# Patient Record
Sex: Female | Born: 1955 | Race: Black or African American | Hispanic: No | Smoking: Never smoker
Health system: Southern US, Community
[De-identification: ages and names within clinical notes are randomized; demographics above are authoritative.]

## PROBLEM LIST (undated history)

## (undated) DIAGNOSIS — C801 Malignant (primary) neoplasm, unspecified: Secondary | ICD-10-CM

## (undated) DIAGNOSIS — Z9221 Personal history of antineoplastic chemotherapy: Secondary | ICD-10-CM

## (undated) HISTORY — PX: ABDOMINAL HYSTERECTOMY: SHX81

## (undated) SURGERY — Surgical Case
Anesthesia: *Unknown

---

## 2004-08-18 ENCOUNTER — Ambulatory Visit: Payer: Self-pay | Admitting: Internal Medicine

## 2004-08-21 ENCOUNTER — Ambulatory Visit: Payer: Self-pay | Admitting: Internal Medicine

## 2005-02-23 ENCOUNTER — Ambulatory Visit: Payer: Self-pay | Admitting: Internal Medicine

## 2005-12-24 ENCOUNTER — Ambulatory Visit: Payer: Self-pay | Admitting: Internal Medicine

## 2006-06-21 ENCOUNTER — Ambulatory Visit: Payer: Self-pay | Admitting: Internal Medicine

## 2007-06-23 ENCOUNTER — Ambulatory Visit: Payer: Self-pay | Admitting: Internal Medicine

## 2008-10-25 ENCOUNTER — Ambulatory Visit: Payer: Self-pay | Admitting: Internal Medicine

## 2009-10-31 ENCOUNTER — Ambulatory Visit: Payer: Self-pay | Admitting: Internal Medicine

## 2010-11-04 ENCOUNTER — Ambulatory Visit: Payer: Self-pay | Admitting: Internal Medicine

## 2011-11-09 ENCOUNTER — Ambulatory Visit: Payer: Self-pay | Admitting: Internal Medicine

## 2012-07-10 DIAGNOSIS — C801 Malignant (primary) neoplasm, unspecified: Secondary | ICD-10-CM

## 2012-07-10 HISTORY — DX: Malignant (primary) neoplasm, unspecified: C80.1

## 2012-08-01 LAB — CBC
HCT: 28.8 % — ABNORMAL LOW (ref 35.0–47.0)
MCH: 31 pg (ref 26.0–34.0)
MCHC: 34 g/dL (ref 32.0–36.0)
MCV: 91 fL (ref 80–100)
Platelet: 257 10*3/uL (ref 150–440)
RBC: 3.16 10*6/uL — ABNORMAL LOW (ref 3.80–5.20)
RDW: 14.2 % (ref 11.5–14.5)
WBC: 16.4 10*3/uL — ABNORMAL HIGH (ref 3.6–11.0)

## 2012-08-01 LAB — COMPREHENSIVE METABOLIC PANEL
Albumin: 3.6 g/dL (ref 3.4–5.0)
Alkaline Phosphatase: 62 U/L (ref 50–136)
BUN: 38 mg/dL — ABNORMAL HIGH (ref 7–18)
Bilirubin,Total: 0.3 mg/dL (ref 0.2–1.0)
Chloride: 107 mmol/L (ref 98–107)
Creatinine: 0.84 mg/dL (ref 0.60–1.30)
EGFR (African American): >60
Osmolality: 294 (ref 275–301)
SGPT (ALT): 23 U/L (ref 12–78)
Total Protein: 6.8 g/dL (ref 6.4–8.2)

## 2012-08-02 ENCOUNTER — Inpatient Hospital Stay: Payer: Self-pay | Admitting: Unknown Physician Specialty

## 2012-08-02 LAB — PROTIME-INR
INR: 1
Prothrombin Time: 13.9 secs (ref 11.5–14.7)

## 2012-08-02 LAB — APTT: Activated PTT: 26.7 secs (ref 23.6–35.9)

## 2012-08-03 LAB — CBC WITH DIFFERENTIAL/PLATELET
Basophil #: 0.1 10*3/uL (ref 0.0–0.1)
Basophil %: 0.5 %
Eosinophil %: 0.3 %
Lymphocyte #: 2.8 10*3/uL (ref 1.0–3.6)
Lymphocyte %: 19.9 %
MCH: 29.8 pg (ref 26.0–34.0)
MCV: 89 fL (ref 80–100)
Monocyte #: 1.2 x10 3/mm — ABNORMAL HIGH (ref 0.2–0.9)
Monocyte %: 8.7 %
Neutrophil %: 70.6 %
Platelet: 153 10*3/uL (ref 150–440)
RBC: 3.14 10*6/uL — ABNORMAL LOW (ref 3.80–5.20)
RDW: 15.8 % — ABNORMAL HIGH (ref 11.5–14.5)
WBC: 14 10*3/uL — ABNORMAL HIGH (ref 3.6–11.0)

## 2012-08-03 LAB — COMPREHENSIVE METABOLIC PANEL
Alkaline Phosphatase: 57 U/L (ref 50–136)
Anion Gap: 8 (ref 7–16)
BUN: 16 mg/dL (ref 7–18)
Bilirubin,Total: 0.4 mg/dL (ref 0.2–1.0)
Calcium, Total: 7.6 mg/dL — ABNORMAL LOW (ref 8.5–10.1)
Chloride: 113 mmol/L — ABNORMAL HIGH (ref 98–107)
Creatinine: 0.66 mg/dL (ref 0.60–1.30)
EGFR (African American): >60
EGFR (Non-African Amer.): >60
Glucose: 87 mg/dL (ref 65–99)
SGOT(AST): 15 U/L (ref 15–37)
Total Protein: 5 g/dL — ABNORMAL LOW (ref 6.4–8.2)

## 2012-08-03 LAB — HEMOGLOBIN: HGB: 9.3 g/dL — ABNORMAL LOW (ref 12.0–16.0)

## 2012-10-13 ENCOUNTER — Ambulatory Visit: Payer: Self-pay | Admitting: Unknown Physician Specialty

## 2012-10-17 LAB — PATHOLOGY REPORT

## 2012-11-10 ENCOUNTER — Ambulatory Visit: Payer: Self-pay

## 2013-11-14 ENCOUNTER — Ambulatory Visit: Payer: Self-pay

## 2014-12-19 ENCOUNTER — Ambulatory Visit: Payer: Self-pay

## 2015-02-26 NOTE — Consult Note (Signed)
CC: duodenal mass with UGI bleed.  Pt will be transfered to [facility] due to lack of EUS here.  Pt aware,. they have accepted the patient and will transfer tonight while she is stable. Will send copy of EGD with her.  Electronic Signatures: Manya Silvas (MD)  (Signed on 25-Sep-13 17:22)  Authored  Last Updated: 25-Sep-13 17:22 by Manya Silvas (MD)

## 2015-02-26 NOTE — H&P (Signed)
PATIENT NAME:  Tonya Brewer, Tonya Brewer MR#:  [id number] DATE OF BIRTH:  September 10, 1956  DATE OF ADMISSION:  08/02/2012  PRIMARY CARE PHYSICIAN: Dr. Andrey Farmer, but now she is changing to Dr. Ola Spurr.   REFERRING PHYSICIAN: Dr. Liana Gerold.   CHIEF COMPLAINT: Black, tarry stool and one episode of vomiting with some red blood material.   HISTORY OF PRESENT ILLNESS: Ms. Tonya Brewer is a 59 year old African American female with unremarkable past medical history apart from hypertension for which she is taking a diuretic and hypercholesterolemia. The patient was in her usual state of health until about Sunday, that is two days ago, when she started seeing black, tarry stool associated with epigastric dull pain. The severity was mild. With time she developed generalized weakness. Finally, due to persistence of melena, she decided to come to the Emergency Department and around 9:00 p.m. last night she had one episode of vomiting after she ate yogurt. The vomiting was consistent of digested material along with red blood mixed with that. The patient was evaluated here in the Emergency Department and she was found to be tachycardiac and her hemoglobin was 9.8. The patient is now in the process of being admitted to the Intensive Care Unit for further evaluation and treatment.   REVIEW OF SYSTEMS: CONSTITUTIONAL: Denies any fever. No chills, but she has fatigue. EYES: No blurring of vision. No double vision. ENT: No hearing impairment. No sore throat. No dysphagia. CARDIOVASCULAR: No chest pain. No shortness of breath. No edema. No syncope. RESPIRATORY: No cough. No production. No shortness of breath. No chest pain.  GASTROINTESTINAL: She had epigastric pain, melena and one episode of vomiting some red blood. GENITOURINARY: No dysuria. No frequency of urination. MUSCULOSKELETAL: No joint pain or swelling. No muscular pain or swelling. INTEGUMENTARY: No skin rash. No ulcers. NEUROLOGY: No focal weakness. No seizure activity.  No headache. PSYCHIATRY: No anxiety. No depression. ENDOCRINE: No polyuria or polydipsia. No heat or cold intolerance.   PAST MEDICAL HISTORY:    1. In 2003 she had colonoscopy that revealed three polyps at the transverse colon. Four of them were 7 mm, one of them was 5 mm. Also found to have diverticulosis and internal hemorrhoids. She is supposed to follow up in two years.  2. She had esophagogastroduodenoscopy or upper endoscope in 2003 and that showed normal esophagus and duodenum. There was some gastritis.  3. Hypertension.  4. Hypercholesterolemia.   PAST SURGICAL HISTORY: Hysterectomy.   FAMILY HISTORY: Her father died in his 1s after having stroke. Her mother died at age of 70 from diabetic coma.   SOCIAL HISTORY: She is married, living with her husband. 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   ADMISSION MEDICATIONS:  1. Hydrochlorothiazide once a day. 2. Pravastatin 40 mg a day.   ALLERGIES: No known drug allergies.   PHYSICAL EXAMINATION:  VITAL SIGNS: Blood pressure 128/82, respiratory rate 16, pulse 106, temperature 98.9. Her oxygen saturation 98%.   GENERAL APPEARANCE: Middle-aged female lying in bed in no acute distress.   HEENT: No pallor. No icterus. No cyanosis.   ENT: Hearing was normal. Nasal mucosa, lips, tongue were normal.   EYES: Normal eyelids and conjunctivae. Pupils about 5 mm, equal and reactive to light.   NECK: Supple. Trachea at midline. No thyromegaly. No cervical lymphadenopathy. No masses.   HEART: Normal S1, S2. No S3 or S4. No murmur. No gallop. No carotid bruits.   RESPIRATORY: Normal breathing pattern without use of accessory  muscles. No rales. No wheezing.   ABDOMEN: Soft. No tenderness. No rebound. No rigidity. No hepatosplenomegaly. No masses. No hernias.   SKIN: No ulcers. No subcutaneous nodules.   MUSCULOSKELETAL: No joint swelling. No clubbing.   NEUROLOGIC: Cranial nerves II through XII are  intact. No focal motor deficit.   PSYCHIATRIC: The patient is alert, oriented and oriented x3. Mood and affect were normal.   LABORATORY, DIAGNOSTIC, AND RADIOLOGICAL DATA: Serum glucose 127, BUN 38, creatinine 0.8, sodium 142, potassium 4.4. Lipase 61. Normal liver function tests. CBC showed white count 16,000, hemoglobin 9.8, hematocrit 28, platelet count 257. Prothrombin time 13. INR 1. APTT 26.   ASSESSMENT:  1. Melena x2 days. 2. One episode of hematemesis. 3. Sinus tachycardia, likely secondary to volume depletion from gastrointestinal bleed, but there is possibly some anxiety component as well.  4. Leukocytosis likely reactive to the gastrointestinal bleed.   OTHER MEDICAL PROBLEMS:  1. History of colon polyps.  2. Systemic hypertension.  3. Hypercholesterolemia.   PLAN:  1. Will admit addition to the Intensive Care Unit for close monitoring.  2. Follow-up on hemoglobin every six hours.  3. Intravenous Protonix 40 mg was given, then intravenous Protonix drip was ordered.  4. Type and hold 1 unit of packed red blood cells.  5. Gastroenterology consultation with Dr. Gustavo Lah.  6. IV hydration with normal saline.  7. I will hold her home medications.  8. Keep the patient n.p.o.   TIME SPENT EVALUATING THIS PATIENT: More than 55 minutes.   ____________________________ Clovis Pu. Lenore Manner, MD amd:ap D: 08/02/2012 04:01:18 ET T: 08/02/2012 09:03:36 ET JOB#: [id number]  cc: Clovis Pu. Lenore Manner, MD, <Dictator> Cheral Marker. Ola Spurr, MD Ellin Saba MD ELECTRONICALLY SIGNED 08/02/2012 22:34

## 2015-02-26 NOTE — Consult Note (Signed)
CC:  UGI bleed,  pt had duodenal mass with erosion on EGD.  CT shows 3cm mass in this area but not of pancreas.  VSS, afebrile, hgb stable at 9.1.  No further vomiting.  She needs an endoscopic ultrasound and she and husband would like to use [facility] so I will contact Dr. Ardis Hughs about doing this.  Possiblilty of surgery discussed.  Will start clear liquids.  Electronic Signatures: Manya Silvas (MD)  (Signed on 25-Sep-13 10:59)  Authored  Last Updated: 25-Sep-13 10:59 by Manya Silvas (MD)

## 2015-02-26 NOTE — Consult Note (Signed)
CC: GI bleed, EGD showed mass with erosion into the duodenal mucosa in area 2ed-third portion.  Fresh blood caked on mucosa.  This appears to be the bleeding area.  Also showed diffuse mild gastritis without fresh blood.  Will get surgical consult and transfuse a third unit of blood.  Clear liquid diet.  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  Get CT of abd tomorrow with contrast.    Electronic Signatures: Manya Silvas (MD)  (Signed on 24-Sep-13 17:30)  Authored  Last Updated: 24-Sep-13 17:30 by Manya Silvas (MD)

## 2015-02-26 NOTE — Consult Note (Signed)
PATIENT NAME:  Tonya Brewer, Tonya Brewer MR#:  [id number] DATE OF BIRTH:  [birth year]  DATE OF CONSULTATION:  08/02/2012  REFERRING PHYSICIAN:   CONSULTING PHYSICIAN:  Manya Silvas, MD  HISTORY OF PRESENT ILLNESS: The patient is a 59 year old black female who had the onset of epigastric abdominal pain Sunday. She had vomiting Monday night with food and bright red blood mixed in with the food.  She was brought to the ER and found to have tachycardia and hemoglobin 9.8. She was admitted to the hospital and I was asked to see her in consultation for GI bleeding.   Two days ago she started seeing black, tarry stools with the onset of the epigastric pain.   She denies any nonsteroidal anti-inflammatory drug use such as Advil, Aleve, ibuprofen, Goody's, BCs, Bufferin.   PAST MEDICAL HISTORY:  1. Colonoscopy in 2003 with a few polyps, diverticulosis, and internal hemorrhoids. Upper endoscopy showed gastritis at that time.  2. Hypertension.  3. Hypercholesterolemia,   FAMILY HISTORY: Father died in his 26s of a stroke. Mother died at age 80, diabetes.   HABITS: Does not smoke, does not drink, does not chew, does not drip.   SOCIAL HISTORY: Works at a [employer] home.   MEDICATIONS:  1. HCTZ once a day.  2. Pravastatin 40 mg a day.   ALLERGIES: No known drug allergies.   PAST SURGICAL HISTORY:  Hysterectomy.   REVIEW OF SYSTEMS: No syncopal spells. No severe headaches. No recent changes in vision or hearing. No sores in the mouth. No bleeding from her nose. No trouble swallowing her food. No asthma, wheezing, emphysema, chronic cough, or shortness of breath. No exertional chest pains. No skipping, irregular heartbeats. No other abdominal pain except the epigastric pain. No diarrhea. No previous GI bleeding noted. No rashes. No severe arthritis.   PHYSICAL EXAMINATION:  GENERAL: Pleasant black female in no acute distress.  VITAL SIGNS:  Pulse 112, respirations 20, blood pressure 127/63,  oxygen saturation 97% on room air.   HEENT: Sclerae anicteric. Conjunctivae slightly pale. Tongue is somewhat pale.  Head is atraumatic. There is no blood in her mouth or on the tongue or coming from the nose.   CHEST: Clear.   HEART: No murmurs or gallops. Mild tachycardia.   ABDOMEN: Bowel sounds present. No hepatosplenomegaly. No masses. No bruits. No significant tenderness.   EXTREMITIES: No edema.   SKIN: Warm and dry.   PSYCH: Mood and affect are appropriate and alert.  LABORATORY, DIAGNOSTIC, AND RADIOLOGICAL DATA: Glucose 127, BUN 38, creatinine 0.84, sodium 142, potassium 4.4, chloride 107, CO2 27, lipase 61, calcium 9.3, total protein 6.8, albumin 3.6, total bilirubin 0.3, alkaline phosphatase 62, SGOT 17, SGPT 23, white count 16.4, hemoglobin 9.8, hematocrit 28.8, platelet count 257. Repeat hemoglobin done eight hours later shows it down to 7.5. She B+ blood with negative antibody screen. PT 13.9, INR 1.   ASSESSMENT: Upper GI bleed. Given her elevated BUN to creatinine ratio, her black stools, her hematemesis, probably an ulcer, either gastric or duodenal.   PLAN: The plan is to repeat her hemoglobin in a few minutes and if below 7 give a transfusion. Do an upper endoscopy this afternoon and keep patient n.p.o. after some water at this time. The plan was discussed the patient and she is in agreement.     ____________________________ Manya Silvas, MD rte:bjt D: 08/02/2012 12:57:44 ET T: 08/02/2012 13:36:15 ET JOB#: [id number]  cc: Manya Silvas, MD, <Dictator> Cheral Marker. Ola Spurr, [place]  Federico Flake MD ELECTRONICALLY SIGNED 08/20/2012 11:25

## 2015-02-26 NOTE — Consult Note (Signed)
CC duodenal mass.  Discussed case with partners, with radiologist, surgeon, Pt prefers [facility], will try to contact Dr. Ardis Hughs for EUS.    Electronic Signatures: Manya Silvas (MD)  (Signed on 25-Sep-13 15:34)  Authored  Last Updated: 25-Sep-13 15:34 by Manya Silvas (MD)

## 2015-02-26 NOTE — Discharge Summary (Signed)
PATIENT NAME:  Tonya Brewer, Tonya Brewer MR#:  [id number] DATE OF BIRTH:  [birth year]  DATE OF ADMISSION:  08/02/2012 DATE OF DISCHARGE:  08/03/2012   HISTORY OF PRESENT ILLNESS: The patient is a 59 year old black female who was admitted to the hospital with GI bleeding with melena and hematemesis She had a fall in hemoglobin and was noted to be bleeding. She initially had a hemoglobin of 9.8 and it fell to 7.5 on September 24th and down to 6.3. The patient received 2 units of blood. Hemoglobin rose to 9.4. Last hemoglobin today was 9.3.   PAST MEDICAL HISTORY: 1. Hypertension. 2. Elevated cholesterol.   PAST SURGICAL HISTORY:  1. Hysterectomy. 2. Previous colonoscopy was 2003 on which she had three polyps in the transverse colon, 7 mm. 3. Endoscopy in 2003 showed some gastritis.   FAMILY HISTORY: Father died of a stroke. Mother died of diabetic coma.  ADMISSION MEDICATIONS:  1. HCTZ. 2. Pravastatin.   ALLERGIES: No known drug allergies.   PHYSICAL EXAMINATION: Admission blood pressure 128/82, pulse 106, temperature 98.9, respirations 16, oxygen sat 98%. CHEST was clear. HEART showed normal S1 and S2. No gallops. ABDOMEN: Soft, nontender. No masses. No hepatosplenomegaly   LABORATORY, DIAGNOSTIC, AND RADIOLOGICAL DATA: Admission labs showed glucose 127, BUN 38, creatinine 0.8, sodium 142, potassium 4.4, lipase 61, WBC 16, hemoglobin 9.8, platelet count 257. PT 13. INR 1. PTT 26.   HOSPITAL COURSE: The patient had a consultation and an upper endoscopy was performed yesterday showing a mass in the duodenum with fresh blood on a cone of tissue that appeared to be extruding from the mucosa. This appeared to be between the second and third portions of the duodenum. A CAT scan was done today showing a mass in the duodenum.   It was felt that the patient needs endoscopic ultrasound and probable duodenal resection. This is a difficult area to operate in and endoscopic service is not available at this  hospital, therefore, arrangements were made to transfer to [facility] in [facility] for further evaluation and probable surgery. This was discussed with the patient and she is complete agreement.  LAST VITAL SIGNS: Blood pressure 122/70, pulse 72, oxygen sat 98% on room air.   Last albumin 2.6, total bilirubin 0.4, alkaline phosphatase 57, SGOT 15, SGPT 13, potassium 3.2, BUN 16, creatinine 0.66, hemoglobin 9.3, white blood count 14, platelet count 153. She has B+ blood but negative antibody screen.    ____________________________ Manya Silvas, MD rte:drc D: 08/03/2012 17:29:30 ET T: 08/03/2012 17:43:38 ET JOB#: [id number]  cc: Manya Silvas, MD, <Dictator> Cheral Marker. Ola Spurr, MD Manya Silvas MD ELECTRONICALLY SIGNED 08/20/2012 11:26

## 2015-02-26 NOTE — Consult Note (Signed)
CC: UGI bleed.  Pt seen and examined.  Await next hgb and plan EGD late this afternoon. Pt tongue pale, was tachy to 136 when up to bedside commode.  Likely will need a unit this afternoon.  Discussed with patient.  Electronic Signatures: Manya Silvas (MD)  (Signed on 24-Sep-13 12:52)  Authored  Last Updated: 24-Sep-13 12:52 by Manya Silvas (MD)

## 2015-12-17 ENCOUNTER — Other Ambulatory Visit: Payer: Self-pay | Admitting: Infectious Diseases

## 2015-12-17 DIAGNOSIS — Z1231 Encounter for screening mammogram for malignant neoplasm of breast: Secondary | ICD-10-CM

## 2015-12-24 ENCOUNTER — Ambulatory Visit
Admission: RE | Admit: 2015-12-24 | Discharge: 2015-12-24 | Disposition: A | Discharge status: Home / Self Care | Payer: 59 | Source: Ambulatory Visit | Attending: Infectious Diseases | Admitting: Infectious Diseases

## 2015-12-24 DIAGNOSIS — Z1231 Encounter for screening mammogram for malignant neoplasm of breast: Secondary | ICD-10-CM | POA: Insufficient documentation

## 2015-12-24 HISTORY — DX: Malignant (primary) neoplasm, unspecified: C80.1

## 2016-12-07 ENCOUNTER — Other Ambulatory Visit: Payer: Self-pay | Admitting: Infectious Diseases

## 2016-12-07 DIAGNOSIS — Z1231 Encounter for screening mammogram for malignant neoplasm of breast: Secondary | ICD-10-CM

## 2016-12-29 ENCOUNTER — Ambulatory Visit
Admission: RE | Admit: 2016-12-29 | Discharge: 2016-12-29 | Disposition: A | Discharge status: Home / Self Care | Payer: BLUE CROSS/BLUE SHIELD | Source: Ambulatory Visit | Attending: Infectious Diseases | Admitting: Infectious Diseases

## 2016-12-29 DIAGNOSIS — Z1231 Encounter for screening mammogram for malignant neoplasm of breast: Secondary | ICD-10-CM | POA: Insufficient documentation

## 2016-12-29 HISTORY — DX: Personal history of antineoplastic chemotherapy: Z92.21

## 2017-11-01 ENCOUNTER — Emergency Department: Payer: BLUE CROSS/BLUE SHIELD

## 2017-11-01 ENCOUNTER — Emergency Department
Admission: EM | Admit: 2017-11-01 | Discharge: 2017-11-01 | Disposition: A | Discharge status: Home / Self Care | Payer: BLUE CROSS/BLUE SHIELD | Attending: Emergency Medicine | Admitting: Emergency Medicine

## 2017-11-01 DIAGNOSIS — S82831A Other fracture of upper and lower end of right fibula, initial encounter for closed fracture: Secondary | ICD-10-CM | POA: Diagnosis not present

## 2017-11-01 DIAGNOSIS — Z79899 Other long term (current) drug therapy: Secondary | ICD-10-CM | POA: Insufficient documentation

## 2017-11-01 DIAGNOSIS — Y929 Unspecified place or not applicable: Secondary | ICD-10-CM | POA: Diagnosis not present

## 2017-11-01 DIAGNOSIS — Y999 Unspecified external cause status: Secondary | ICD-10-CM | POA: Diagnosis not present

## 2017-11-01 DIAGNOSIS — W010XXA Fall on same level from slipping, tripping and stumbling without subsequent striking against object, initial encounter: Secondary | ICD-10-CM | POA: Insufficient documentation

## 2017-11-01 DIAGNOSIS — S8991XA Unspecified injury of right lower leg, initial encounter: Secondary | ICD-10-CM | POA: Diagnosis present

## 2017-11-01 DIAGNOSIS — Y9301 Activity, walking, marching and hiking: Secondary | ICD-10-CM | POA: Insufficient documentation

## 2017-11-01 MED ORDER — HYDROCODONE-ACETAMINOPHEN 5-325 MG PO TABS: 1.0000 | ORAL_TABLET | ORAL | 0 refills | Status: AC | PRN

## 2017-11-01 NOTE — ED Provider Notes (Signed)
[facility] Emergency Department Provider Note  ____________________________________________   First MD Initiated Contact with Patient 11/01/17 [phone]     (approximate)  I have reviewed the triage vital signs and the nursing notes.   HISTORY  Chief Complaint Ankle Injury    HPI Tonya Brewer is a 61 y.o. female who presents for evaluation of acute onset severe pain which is mild at rest but worse with movement of her right lower leg.  Her granddaughter was with her in bed and when the granddaughter started to vomit the patient got up quickly, twisted, and fell with acute onset of the pain.  It is primarily at the bottom of her right leg which she also has some associated right knee pain although the pain in the bottom of her right leg and the inner ankle is the worse.  She has some swelling of the distal leg and medial ankle.  She did not strike her head and did not lose consciousness.  She denies any other injuries.  As long as she is resting the leg it is not hurting her, but if she moves it or tries to bear weight the pain is severe.  She has no numbness nor tingling.   Past Medical History:  Diagnosis Date  . Cancer ([place]) 07/2012   GI cancer, takes chemo pill every day  . Personal history of chemotherapy     There are no active problems to display for this patient.   Past Surgical History:  Procedure Laterality Date  . ABDOMINAL HYSTERECTOMY      Prior to Admission medications   Medication Sig Start Date End Date Taking? Authorizing Provider  imatinib (GLEEVEC) 400 MG tablet Take 400 mg by mouth daily.   Yes [provider]  loratadine (CLARITIN) 10 MG tablet Take 10 mg by mouth daily as needed for allergies.   Yes [provider]  lovastatin (MEVACOR) 40 MG tablet Take 40 mg by mouth every evening.   Yes [provider]  potassium chloride SA (K-DUR,KLOR-CON) 20 MEQ tablet Take 1 tablet by mouth daily.   Yes  [provider]  triamterene-hydrochlorothiazide (MAXZIDE-25) 37.5-25 MG tablet Take 1 tablet by mouth daily.   Yes [provider]  HYDROcodone-acetaminophen (NORCO/VICODIN) 5-325 MG tablet Take 1-2 tablets by mouth every 4 (four) hours as needed for moderate pain. 11/01/17   Hinda Kehr, MD    Allergies Patient has no known allergies.  Family History  Problem Relation Age of Onset  . Breast cancer Neg Hx     Social History Social History   Tobacco Use  . Smoking status: Not on file  Substance Use Topics  . Alcohol use: Not on file  . Drug use: Not on file    Review of Systems Constitutional: No fever/chills Cardiovascular: Denies chest pain. Respiratory: Denies shortness of breath. Gastrointestinal: No abdominal pain.  No nausea, no vomiting.   Musculoskeletal: Pain and swelling and right distal leg and medial right ankle Integumentary: Negative for lacerations Neurological: Negative for headaches, focal weakness or numbness.   ____________________________________________   PHYSICAL EXAM:  VITAL SIGNS: ED Triage Vitals  Enc Vitals Group     BP 11/01/17 0349 (!) 175/86     Pulse Rate 11/01/17 0349 (!) 110     Resp 11/01/17 0349 20     Temp 11/01/17 0349 98.5 F (36.9 C)     Temp Source 11/01/17 0349 Oral     SpO2 11/01/17 0349 94 %  Weight 11/01/17 0344 108.9 kg (240 lb)     Height 11/01/17 0344 1.549 m (5\' 1" )     Head Circumference --      Peak Flow --      Pain Score 11/01/17 0344 7     Pain Loc --      Pain Edu? --      Excl. in [place]? --     Constitutional: Alert and oriented. Well appearing and in no acute distress. Head: Atraumatic. Cardiovascular: Normal rate, regular rhythm. Good peripheral circulation.  Respiratory: Normal respiratory effort.  No retractions.  Musculoskeletal: Swelling to right medial malleolus with tenderness to palpation and some mild ecchymosis.  Tenderness to palpation of distal tib-fib of the right leg.   Compartments are tense but easily palpable and not indicative of compartment syndrome, and pain/tenderness is appropriate and not out of proportion.  No other gross deformities of extremities are appreciated. Neurologic:  Normal speech and language. No gross focal neurologic deficits are appreciated.  Skin:  Skin is warm, dry and intact. No rash noted. Psychiatric: Mood and affect are normal. Speech and behavior are normal.  ____________________________________________   LABS (all labs ordered are listed, but only abnormal results are displayed)  Labs Reviewed - No data to display ____________________________________________  EKG  None - EKG not ordered by ED physician ____________________________________________  RADIOLOGY Ursula Alert, personally viewed and evaluated these images (plain radiographs) as part of my medical decision making, as well as reviewing the written report by the radiologist.  Dg Tibia/fibula Right  Result Date: 11/01/2017 CLINICAL DATA:  Status post fall, with right lower leg pain. Initial encounter. EXAM: RIGHT TIBIA AND FIBULA - 2 VIEW COMPARISON:  None. FINDINGS: There is a mildly displaced oblique fracture at the distal fibular diaphysis, with mild lateral and posterior displacement. A small osseous fragment at the medial malleolus may reflect an avulsion injury. Soft tissue swelling is noted about the ankle. The interosseous space is preserved. The proximal tibia and fibula appear intact. IMPRESSION: 1. Mildly displaced oblique fracture of the distal fibular diaphysis, with mild lateral and posterior displacement. 2. Small osseous fragment at the medial malleolus may reflect an avulsion injury. Electronically Signed   By: Garald Balding M.D.   On: 11/01/2017 04:29   Dg Ankle Complete Right  Result Date: 11/01/2017 CLINICAL DATA:  Status post fall, with right ankle pain. Initial encounter. EXAM: RIGHT ANKLE - COMPLETE 3+ VIEW COMPARISON:  None. FINDINGS:  There is a mildly displaced oblique fracture through the distal fibular diaphysis, with mild lateral and posterior displacement. A small osseous fragment at the distal tip of the medial malleolus may reflect an avulsion fracture. Diffuse soft tissue swelling is noted about the ankle. The ankle mortise is grossly unremarkable. A plantar calcaneal spur is noted. The interosseous space is preserved. IMPRESSION: 1. Mildly displaced oblique fracture through the distal fibular diaphysis, with mild lateral and posterior displacement. 2. Small osseous fragment at the distal tip of the medial malleolus may reflect an avulsion fracture. Electronically Signed   By: Garald Balding M.D.   On: 11/01/2017 04:30   Dg Knee Complete 4 Views Right  Result Date: 11/01/2017 CLINICAL DATA:  Status post fall, with right knee stiffness. Initial encounter. EXAM: RIGHT KNEE - COMPLETE 4+ VIEW COMPARISON:  None. FINDINGS: There is no evidence of acute fracture or dislocation. There is mild narrowing of the medial compartment. Marginal osteophyte formation is noted at the medial compartment. A linear lucency at the superior pole  of the patella may reflect remote injury. No significant joint effusion is seen. The visualized soft tissues are normal in appearance. IMPRESSION: 1. No evidence of acute fracture or dislocation. 2. Minimal changes of osteoarthritis. 3. Linear lucency at the superior pole of the patella may reflect remote injury. Electronically Signed   By: Garald Balding M.D.   On: 11/01/2017 04:31    ____________________________________________   PROCEDURES  Critical Care performed: No   Procedure(s) performed:   .Splint Application Date/Time: 09/32/3557 7:03 AM Performed by: Hinda Kehr, MD Authorized by: Hinda Kehr, MD   Consent:    Consent obtained:  Verbal   Consent given by:  Patient   Risks discussed:  Numbness, swelling, pain and discoloration Pre-procedure details:    Sensation:   Normal Procedure details:    Laterality:  Right   Location:  Leg   Leg:  R lower leg   Strapping: yes     Splint type:  Short leg   Supplies:  Ortho-Glass Post-procedure details:    Pain:  Unchanged   Sensation:  Normal   Patient tolerance of procedure:  Tolerated well, no immediate complications     ____________________________________________   INITIAL IMPRESSION / ASSESSMENT AND PLAN / ED COURSE  As part of my medical decision making, I reviewed the following data within the [place] notes reviewed and incorporated, Radiograph reviewed  and discussed by phone with orthopedics (Dr. Harlow Mares), reviewed [place] controlled substance database.    Differential diagnosis includes, but is not limited to, fracture/dislocation anywhere throughout the right lower extremity including femur, knee, and tib-fib/ankle.  I suspect medial malleolar injury but given the area of pain I am obtaining radiographs of the knee, tib-fib, and ankle.  No indication for head CT or C-spine CT at this time.  Clinical Course as of Nov 01 712  Mon Nov 01, 2017  3220 Paging Dr. Rudene Christians to discuss fibular fracture  [CF]  0542 Repaged Dr. Rudene Christians.  Patient stable and in no acute distress.  [CF]  0602 Dr. Rudene Christians called back, said that he is listed on the call schedule erroneously, and that I should call Dr. Marry Guan.  We are doing so now.  [CF]  [phone] Going to await 7:00 orthopedic surgeon.  Explained plan to patient.  Placing posterior splint now in the meantime.  [CF]  0700 Splint appropriately placed and patient is N/V intact after splint.  Will call orthopedics shortly.  [CF]  0710 Spoke by phone with Dr. Harlow Mares. He agreed with the management plan and will put her on his list to see her in clinic on Wed or Thurs.  Updated patient.  [CF]  [phone] I reviewed the patient's prescription history over the last 24 months in the multi-state controlled substances database(s) that includes [place], [place],  [place], [place], [place], [place], [place], [place], [place], [place], [place], [place], [place], [place], and [place].  The patient has filled no controlled substances during that time.   [CF]    Clinical Course User Index [CF] Hinda Kehr, MD    ____________________________________________  FINAL CLINICAL IMPRESSION(S) / ED DIAGNOSES  Final diagnoses:  Closed fracture of distal end of right fibula, unspecified fracture morphology, initial encounter     MEDICATIONS GIVEN DURING THIS VISIT:  Medications - No data to display   ED Discharge Orders        Ordered    HYDROcodone-acetaminophen (NORCO/VICODIN) 5-325 MG tablet  Every 4 hours PRN     11/01/17  0702       Note:  This document was prepared using Dragon voice recognition software and may include unintentional dictation errors.    Hinda Kehr, MD 11/01/17 [phone]

## 2017-11-01 NOTE — ED Notes (Signed)
Pt expressing no needs at this time.

## 2017-11-01 NOTE — Discharge Instructions (Signed)
Please read through the included information about splint care (keep it clean and dry).  If your pain becomes much more severe, the splint becomes too tight, or you feel as if your injured limb is becoming numb or cold, please return immediately to the Emergency Department.  Stay off your leg (non-weight-bearing), using crutches or a wheelchair to get around.  Use over-the-counter pain medication as needed, or any other pain medication you have available at home.  Take Norco as prescribed for severe pain. Do not drink alcohol, drive or participate in any other potentially dangerous activities while taking this medication as it may make you sleepy. Do not take this medication with any other sedating medications, either prescription or over-the-counter. If you were prescribed Percocet or Vicodin, do not take these with acetaminophen (Tylenol) as it is already contained within these medications.   This medication is an opiate (or narcotic) pain medication and can be habit forming.  Use it as little as possible to achieve adequate pain control.  Do not use or use it with extreme caution if you have a history of opiate abuse or dependence.  If you are on a pain contract with your primary care doctor or a pain specialist, be sure to let them know you were prescribed this medication today from the [facility] Emergency Department.  This medication is intended for your use only - do not give any to anyone else and keep it in a secure place where nobody else, especially children, have access to it.  It will also cause or worsen constipation, so you may want to consider taking an over-the-counter stool softener while you are taking this medication.   Follow up with the orthopedics specialist listed in this paperwork.  When possible, keep your splint elevated to help with the swelling.  You may also use ice packs over the splint.

## 2017-11-01 NOTE — ED Triage Notes (Signed)
Pt states she was getting up to get a towel for her granddaughter that was vomiting when she slipped and injured right ankle. Pt assisted out of car and into a wheelchair, pt is able to bear weight on ankle.

## 2017-11-01 NOTE — ED Notes (Signed)
Pt tolerated application of splint well. Capillary refill <3 seconds and pt has sensation on all toes.

## 2017-11-01 NOTE — ED Triage Notes (Signed)
Pt also complains of right knee pain post fall.

## 2017-12-30 ENCOUNTER — Other Ambulatory Visit: Payer: Self-pay | Admitting: Infectious Diseases

## 2017-12-30 DIAGNOSIS — Z1231 Encounter for screening mammogram for malignant neoplasm of breast: Secondary | ICD-10-CM

## 2018-01-05 ENCOUNTER — Ambulatory Visit
Admission: RE | Admit: 2018-01-05 | Discharge: 2018-01-05 | Disposition: A | Discharge status: Home / Self Care | Payer: BLUE CROSS/BLUE SHIELD | Source: Ambulatory Visit | Attending: Infectious Diseases | Admitting: Infectious Diseases

## 2018-01-05 DIAGNOSIS — Z1231 Encounter for screening mammogram for malignant neoplasm of breast: Secondary | ICD-10-CM | POA: Insufficient documentation

## 2018-12-19 ENCOUNTER — Other Ambulatory Visit: Payer: Self-pay | Admitting: Internal Medicine

## 2018-12-19 DIAGNOSIS — Z1231 Encounter for screening mammogram for malignant neoplasm of breast: Secondary | ICD-10-CM

## 2019-01-09 ENCOUNTER — Ambulatory Visit
Admission: RE | Admit: 2019-01-09 | Discharge: 2019-01-09 | Disposition: A | Discharge status: Home / Self Care | Payer: BLUE CROSS/BLUE SHIELD | Source: Ambulatory Visit | Attending: Internal Medicine | Admitting: Internal Medicine

## 2019-01-09 ENCOUNTER — Other Ambulatory Visit: Payer: Self-pay

## 2019-01-09 DIAGNOSIS — Z1231 Encounter for screening mammogram for malignant neoplasm of breast: Secondary | ICD-10-CM | POA: Diagnosis present

## 2019-07-12 ENCOUNTER — Other Ambulatory Visit: Payer: Self-pay

## 2019-07-12 ENCOUNTER — Other Ambulatory Visit
Admission: RE | Admit: 2019-07-12 | Discharge: 2019-07-12 | Disposition: A | Discharge status: Home / Self Care | Payer: Medicare Other | Source: Ambulatory Visit | Attending: Orthopedic Surgery | Admitting: Orthopedic Surgery

## 2019-07-12 DIAGNOSIS — Z20828 Contact with and (suspected) exposure to other viral communicable diseases: Secondary | ICD-10-CM | POA: Insufficient documentation

## 2019-07-12 DIAGNOSIS — Z01812 Encounter for preprocedural laboratory examination: Secondary | ICD-10-CM | POA: Insufficient documentation

## 2019-07-12 LAB — SARS CORONAVIRUS 2 (TAT 6-24 HRS): SARS Coronavirus 2: NEGATIVE

## 2019-07-12 NOTE — Progress Notes (Signed)
Patient has had progressive swelling, increased purulent drainage from thenar volar abscess, now with loss of sensation to thumb despite outpatient antibiotics. Requires urgent I+D to preserve hand function.

## 2019-07-13 ENCOUNTER — Ambulatory Visit: Payer: Medicare Other | Admitting: Anesthesiology

## 2019-07-13 ENCOUNTER — Encounter
Admission: RE | Disposition: A | Discharge status: Home / Self Care | Payer: Self-pay | Source: Home / Self Care | Attending: Orthopedic Surgery

## 2019-07-13 ENCOUNTER — Ambulatory Visit
Admission: RE | Admit: 2019-07-13 | Discharge: 2019-07-13 | Disposition: A | Discharge status: Home / Self Care | Payer: Medicare Other | Attending: Orthopedic Surgery | Admitting: Orthopedic Surgery

## 2019-07-13 ENCOUNTER — Other Ambulatory Visit: Payer: Self-pay

## 2019-07-13 ENCOUNTER — Encounter: Payer: Self-pay | Admitting: *Deleted

## 2019-07-13 DIAGNOSIS — M79642 Pain in left hand: Secondary | ICD-10-CM | POA: Insufficient documentation

## 2019-07-13 DIAGNOSIS — I10 Essential (primary) hypertension: Secondary | ICD-10-CM | POA: Diagnosis not present

## 2019-07-13 DIAGNOSIS — Z79899 Other long term (current) drug therapy: Secondary | ICD-10-CM | POA: Insufficient documentation

## 2019-07-13 DIAGNOSIS — Z90710 Acquired absence of both cervix and uterus: Secondary | ICD-10-CM | POA: Insufficient documentation

## 2019-07-13 DIAGNOSIS — L02512 Cutaneous abscess of left hand: Secondary | ICD-10-CM | POA: Insufficient documentation

## 2019-07-13 DIAGNOSIS — E785 Hyperlipidemia, unspecified: Secondary | ICD-10-CM | POA: Diagnosis not present

## 2019-07-13 DIAGNOSIS — Z9221 Personal history of antineoplastic chemotherapy: Secondary | ICD-10-CM | POA: Diagnosis not present

## 2019-07-13 DIAGNOSIS — K219 Gastro-esophageal reflux disease without esophagitis: Secondary | ICD-10-CM | POA: Diagnosis not present

## 2019-07-13 DIAGNOSIS — Z833 Family history of diabetes mellitus: Secondary | ICD-10-CM | POA: Diagnosis not present

## 2019-07-13 DIAGNOSIS — Z823 Family history of stroke: Secondary | ICD-10-CM | POA: Diagnosis not present

## 2019-07-13 DIAGNOSIS — Q8500 Neurofibromatosis, unspecified: Secondary | ICD-10-CM | POA: Insufficient documentation

## 2019-07-13 HISTORY — PX: INCISION AND DRAINAGE ABSCESS: SHX5864

## 2019-07-13 SURGERY — INCISION AND DRAINAGE, ABSCESS
Anesthesia: General | Site: Hand | Laterality: Left

## 2019-07-13 MED ORDER — CEFAZOLIN SODIUM-DEXTROSE 2-4 GM/100ML-% IV SOLN
2.0000 g | Freq: Once | INTRAVENOUS | Status: AC
  Administered 2019-07-13: 2 g via INTRAVENOUS

## 2019-07-13 MED ORDER — LACTATED RINGERS IV SOLN
Freq: Once | INTRAVENOUS | Status: AC
  Administered 2019-07-13: 14:00:00 via INTRAVENOUS

## 2019-07-13 MED ORDER — NEOMYCIN-POLYMYXIN B GU 40-200000 IR SOLN
Status: DC | PRN
  Administered 2019-07-13: 2 mL

## 2019-07-13 MED ORDER — FENTANYL CITRATE (PF) 100 MCG/2ML IJ SOLN
INTRAMUSCULAR | Status: AC
  Administered 2019-07-13: 17:00:00 25 ug via INTRAVENOUS
  Filled 2019-07-13: qty 2

## 2019-07-13 MED ORDER — LIDOCAINE HCL (PF) 2 % IJ SOLN
INTRAMUSCULAR | Status: AC
  Filled 2019-07-13: qty 10

## 2019-07-13 MED ORDER — MIDAZOLAM HCL 2 MG/2ML IJ SOLN
INTRAMUSCULAR | Status: DC | PRN
  Administered 2019-07-13: 1 mg via INTRAVENOUS

## 2019-07-13 MED ORDER — FAMOTIDINE 20 MG PO TABS
ORAL_TABLET | ORAL | Status: AC
  Administered 2019-07-13: 20 mg via ORAL
  Filled 2019-07-13: qty 1

## 2019-07-13 MED ORDER — EPHEDRINE SULFATE 50 MG/ML IJ SOLN
INTRAMUSCULAR | Status: AC
  Filled 2019-07-13: qty 1

## 2019-07-13 MED ORDER — FENTANYL CITRATE (PF) 100 MCG/2ML IJ SOLN
INTRAMUSCULAR | Status: DC | PRN
  Administered 2019-07-13: 50 ug via INTRAVENOUS
  Administered 2019-07-13: 25 ug via INTRAVENOUS

## 2019-07-13 MED ORDER — FENTANYL CITRATE (PF) 100 MCG/2ML IJ SOLN
25.0000 ug | INTRAMUSCULAR | Status: AC | PRN
  Administered 2019-07-13 (×2): 25 ug via INTRAVENOUS

## 2019-07-13 MED ORDER — LIDOCAINE HCL (CARDIAC) PF 100 MG/5ML IV SOSY
PREFILLED_SYRINGE | INTRAVENOUS | Status: DC | PRN
  Administered 2019-07-13: 60 mg via INTRAVENOUS

## 2019-07-13 MED ORDER — LACTATED RINGERS IV SOLN
INTRAVENOUS | Status: DC | PRN
  Administered 2019-07-13: 15:00:00 via INTRAVENOUS

## 2019-07-13 MED ORDER — SEVOFLURANE IN SOLN
RESPIRATORY_TRACT | Status: AC
  Filled 2019-07-13: qty 250

## 2019-07-13 MED ORDER — BUPIVACAINE HCL (PF) 0.5 % IJ SOLN
INTRAMUSCULAR | Status: AC
  Filled 2019-07-13: qty 30

## 2019-07-13 MED ORDER — MIDAZOLAM HCL 2 MG/2ML IJ SOLN
INTRAMUSCULAR | Status: AC
  Filled 2019-07-13: qty 2

## 2019-07-13 MED ORDER — NEOMYCIN-POLYMYXIN B GU 40-200000 IR SOLN
Status: AC
  Filled 2019-07-13: qty 2

## 2019-07-13 MED ORDER — ONDANSETRON HCL 4 MG/2ML IJ SOLN: 4.0000 mg | Freq: Once | INTRAMUSCULAR | Status: DC | PRN

## 2019-07-13 MED ORDER — PROPOFOL 10 MG/ML IV BOLUS
INTRAVENOUS | Status: DC | PRN
  Administered 2019-07-13: 140 mg via INTRAVENOUS
  Administered 2019-07-13: 20 mg via INTRAVENOUS

## 2019-07-13 MED ORDER — FAMOTIDINE 20 MG PO TABS
20.0000 mg | ORAL_TABLET | Freq: Once | ORAL | Status: AC
Start: 2019-07-13 — End: 2019-07-13
  Administered 2019-07-13: 14:00:00 20 mg via ORAL

## 2019-07-13 MED ORDER — FENTANYL CITRATE (PF) 100 MCG/2ML IJ SOLN
25.0000 ug | INTRAMUSCULAR | Status: AC | PRN
  Administered 2019-07-13 (×6): 25 ug via INTRAVENOUS

## 2019-07-13 MED ORDER — CEFAZOLIN SODIUM-DEXTROSE 2-4 GM/100ML-% IV SOLN
INTRAVENOUS | Status: AC
  Filled 2019-07-13: qty 100

## 2019-07-13 MED ORDER — FENTANYL CITRATE (PF) 100 MCG/2ML IJ SOLN
INTRAMUSCULAR | Status: AC
  Filled 2019-07-13: qty 2

## 2019-07-13 MED ORDER — FENTANYL CITRATE (PF) 100 MCG/2ML IJ SOLN
INTRAMUSCULAR | Status: AC
  Administered 2019-07-13: 25 ug via INTRAVENOUS
  Filled 2019-07-13: qty 2

## 2019-07-13 SURGICAL SUPPLY — 28 items
BNDG ELASTIC 3X5.8 VLCR NS LF (GAUZE/BANDAGES/DRESSINGS) ×3 IMPLANT
CANISTER SUCT 1200ML W/VALVE (MISCELLANEOUS) ×3 IMPLANT
CAST PADDING 3X4FT ST 30246 (SOFTGOODS) ×2
CHLORAPREP W/TINT 26 (MISCELLANEOUS) ×3 IMPLANT
COVER WAND RF STERILE (DRAPES) ×3 IMPLANT
CUFF TOURN SGL QUICK 18X4 (TOURNIQUET CUFF) ×3 IMPLANT
CUFF TOURN SGL QUICK 24 (TOURNIQUET CUFF)
CUFF TRNQT CYL 24X4X16.5-23 (TOURNIQUET CUFF) IMPLANT
DRSG GAUZE FLUFF 36X18 (GAUZE/BANDAGES/DRESSINGS) ×3 IMPLANT
ELECT REM PT RETURN 9FT ADLT (ELECTROSURGICAL) ×3
ELECTRODE REM PT RTRN 9FT ADLT (ELECTROSURGICAL) ×1 IMPLANT
GAUZE PACKING IODOFORM 1/2 (PACKING) ×3 IMPLANT
GAUZE SPONGE 4X4 12PLY STRL (GAUZE/BANDAGES/DRESSINGS) ×3 IMPLANT
GAUZE XEROFORM 1X8 LF (GAUZE/BANDAGES/DRESSINGS) ×3 IMPLANT
GLOVE SURG SYN 9.0  PF PI (GLOVE) ×2
GLOVE SURG SYN 9.0 PF PI (GLOVE) ×1 IMPLANT
GOWN SRG 2XL LVL 4 RGLN SLV (GOWNS) ×1 IMPLANT
GOWN STRL NON-REIN 2XL LVL4 (GOWNS) ×2
GOWN STRL REUS W/ TWL LRG LVL3 (GOWN DISPOSABLE) ×1 IMPLANT
GOWN STRL REUS W/TWL LRG LVL3 (GOWN DISPOSABLE) ×2
KIT TURNOVER KIT A (KITS) ×3 IMPLANT
NS IRRIG 500ML POUR BTL (IV SOLUTION) ×3 IMPLANT
PACK EXTREMITY ARMC (MISCELLANEOUS) ×3 IMPLANT
PAD CAST CTTN 3X4 STRL (SOFTGOODS) ×1 IMPLANT
PAD PREP 24X41 OB/GYN DISP (PERSONAL CARE ITEMS) ×3 IMPLANT
SCALPEL PROTECTED #10 DISP (BLADE) ×3 IMPLANT
SCALPEL PROTECTED #15 DISP (BLADE) ×3 IMPLANT
SUT ETHILON 4 0 P 3 18 (SUTURE) ×3 IMPLANT

## 2019-07-13 NOTE — Anesthesia Procedure Notes (Signed)
Procedure Name: LMA Insertion Date/Time: 07/13/2019 3:35 PM Performed by: Allean Found, CRNA Pre-anesthesia Checklist: Patient identified, Patient being monitored, Timeout performed, Emergency Drugs available and Suction available Patient Re-evaluated:Patient Re-evaluated prior to induction Oxygen Delivery Method: Circle system utilized Preoxygenation: Pre-oxygenation with 100% oxygen Induction Type: IV induction Ventilation: Mask ventilation without difficulty LMA: LMA inserted Tube type: Oral Number of attempts: 1 Placement Confirmation: positive ETCO2 and breath sounds checked- equal and bilateral Tube secured with: Tape Dental Injury: Teeth and Oropharynx as per pre-operative assessment

## 2019-07-13 NOTE — Anesthesia Postprocedure Evaluation (Signed)
Anesthesia Post Note  Patient: Tonya Brewer  Procedure(s) Performed: LEFT HAND INCISION AND DRAINAGE ABSCESS (Left Hand)  Patient location during evaluation: PACU Anesthesia Type: General Level of consciousness: awake and alert Pain management: pain level controlled Vital Signs Assessment: post-procedure vital signs reviewed and stable Respiratory status: spontaneous breathing, nonlabored ventilation, respiratory function stable and patient connected to nasal cannula oxygen Cardiovascular status: blood pressure returned to baseline and stable Postop Assessment: no apparent nausea or vomiting Anesthetic complications: no     Last Vitals:  Vitals:   07/13/19 1722 07/13/19 1738  BP: (!) 168/89 (!) 152/76  Pulse: 87 86  Resp: 16 16  Temp: 36.7 C   SpO2: 97% 95%    Last Pain:  Vitals:   07/13/19 1722  TempSrc: Temporal  PainSc: 2                  Martha Clan

## 2019-07-13 NOTE — Op Note (Signed)
07/13/2019  4:10 PM  PATIENT:  Tonya Brewer  63 y.o. female  PRE-OPERATIVE DIAGNOSIS:  abscess of left hand LEFT HAND PAIN  POST-OPERATIVE DIAGNOSIS:  abscess of left hand LEFT HAND PAIN  PROCEDURE:  Procedure(s): LEFT HAND INCISION AND DRAINAGE ABSCESS (Left)  SURGEON: Laurene Footman, MD  ASSISTANTS: None  ANESTHESIA:   general  EBL:  Total I/O In: 450 [I.V.:350; IV Piggyback:100] Out: 10 [Blood:10]  BLOOD ADMINISTERED:none  DRAINS: Iodophor packing placed   LOCAL MEDICATIONS USED:  NONE  SPECIMEN:  No Specimen  DISPOSITION OF SPECIMEN:  N/A  COUNTS:  YES  TOURNIQUET:  * Missing tourniquet times found for documented tourniquets in log: [id number] *no tourniquet used  IMPLANTS: none  DICTATION: .Dragon Dictation patient was brought to the operating room and after adequate general anesthesia was obtained the left arm was prepped and draped in sterile fashion with a tourniquet applied but not required the upper arm.  After patient identification and timeout procedure were completed skin that was peeling back was removed around the abscess in the thenar mass.  There were 3 punctate areas of drainage.  Material these were probed and the one that was the deepest and largest was extended proximally and distally for a total 2 cm and a large amount of purulent material was expressed from the wound.  None of it appeared to come from the thenar and hyperthenar area is all in the thenar mass this was thoroughly irrigated until no gross purulence was present and iodophor packing then placed followed by fluffs web roll and Ace wrap patient tolerated procedure well was sent recovery stable condition  PLAN OF CARE: Discharge to home after PACU  PATIENT DISPOSITION:  PACU - hemodynamically stable.

## 2019-07-13 NOTE — Anesthesia Preprocedure Evaluation (Addendum)
Anesthesia Evaluation  Patient identified by MRN, date of birth, ID band Patient awake    Reviewed: Allergy & Precautions, NPO status , Patient's Chart, lab work & pertinent test results  Airway Mallampati: III       Dental   Pulmonary neg pulmonary ROS,    Pulmonary exam normal        Cardiovascular negative cardio ROS Normal cardiovascular exam     Neuro/Psych    GI/Hepatic negative GI ROS, Neg liver ROS,   Endo/Other  negative endocrine ROS  Renal/GU negative Renal ROS     Musculoskeletal   Abdominal Normal abdominal exam  (+)   Peds negative pediatric ROS (+)  Hematology negative hematology ROS (+)   Anesthesia Other Findings Past Medical History: 07/2012: Cancer ([place])     Comment:  GI cancer, No date: Personal history of chemotherapy     Comment:  taking a break from chemo pill to see Dr in june 2020  Reproductive/Obstetrics                             Anesthesia Physical Anesthesia Plan  ASA: II  Anesthesia Plan: General   Post-op Pain Management:    Induction: Intravenous  PONV Risk Score and Plan:   Airway Management Planned: LMA  Additional Equipment:   Intra-op Plan:   Post-operative Plan: Extubation in OR  Informed Consent: I have reviewed the patients History and Physical, chart, labs and discussed the procedure including the risks, benefits and alternatives for the proposed anesthesia with the patient or authorized representative who has indicated his/her understanding and acceptance.     Dental advisory given  Plan Discussed with: CRNA and Surgeon  Anesthesia Plan Comments:        Anesthesia Quick Evaluation

## 2019-07-13 NOTE — Anesthesia Post-op Follow-up Note (Signed)
Anesthesia QCDR form completed.        

## 2019-07-13 NOTE — H&P (Signed)
Reviewed paper H+P, will be scanned into chart. No changes noted.  

## 2019-07-13 NOTE — Transfer of Care (Signed)
Immediate Anesthesia Transfer of Care Note  Patient: Tonya Brewer  Procedure(s) Performed: LEFT HAND INCISION AND DRAINAGE ABSCESS (Left Hand)  Patient Location: PACU  Anesthesia Type:General  Level of Consciousness: sedated  Airway & Oxygen Therapy: Patient Spontanous Breathing and Patient connected to face mask oxygen  Post-op Assessment: Report given to RN and Post -op Vital signs reviewed and stable  Post vital signs: Reviewed and stable  Last Vitals:  Vitals Value Taken Time  BP 163/95 07/13/19 1607  Temp 36.4 C 07/13/19 1607  Pulse 89 07/13/19 1608  Resp 16 07/13/19 1608  SpO2 100 % 07/13/19 1608  Vitals shown include unvalidated device data.  Last Pain:  Vitals:   07/13/19 1607  TempSrc:   PainSc: Asleep         Complications: No apparent anesthesia complications

## 2019-07-13 NOTE — Discharge Instructions (Addendum)
Keep arm elevated is much as possible through the weekend.  Keep dressing clean and dry.  Work on finger motion is much as you can tolerate.  Pain medicine as directed.  Call office tomorrow morning if you have any questions.   AMBULATORY SURGERY  DISCHARGE INSTRUCTIONS   1) The drugs that you were given will stay in your system until tomorrow so for the next 24 hours you should not:  A) Drive an automobile B) Make any legal decisions C) Drink any alcoholic beverage   2) You may resume regular meals tomorrow.  Today it is better to start with liquids and gradually work up to solid foods.  You may eat anything you prefer, but it is better to start with liquids, then soup and crackers, and gradually work up to solid foods.   3) Please notify your doctor immediately if you have any unusual bleeding, trouble breathing, redness and pain at the surgery site, drainage, fever, or pain not relieved by medication.    4) Additional Instructions:        Please contact your physician with any problems or Same Day Surgery at [phone], Monday through Friday 6 am to 4 pm, or [place] at [facility] number at [phone].

## 2019-07-31 ENCOUNTER — Encounter: Payer: Self-pay | Admitting: Occupational Therapy

## 2019-07-31 ENCOUNTER — Other Ambulatory Visit: Payer: Self-pay

## 2019-07-31 ENCOUNTER — Ambulatory Visit: Payer: Medicare Other | Attending: Orthopedic Surgery | Admitting: Occupational Therapy

## 2019-07-31 DIAGNOSIS — M25642 Stiffness of left hand, not elsewhere classified: Secondary | ICD-10-CM | POA: Diagnosis present

## 2019-07-31 DIAGNOSIS — M6281 Muscle weakness (generalized): Secondary | ICD-10-CM | POA: Insufficient documentation

## 2019-07-31 DIAGNOSIS — R600 Localized edema: Secondary | ICD-10-CM | POA: Insufficient documentation

## 2019-07-31 DIAGNOSIS — M79642 Pain in left hand: Secondary | ICD-10-CM | POA: Insufficient documentation

## 2019-07-31 NOTE — Therapy (Signed)
[facility] [address], [place], [address] Phone: [phone]   Fax:  [phone]  Occupational Therapy Evaluation  Patient Details  Name: Tonya Brewer MRN: [id number] Date of Birth: 1956-01-13 Referring Provider (OT): Rudene Christians   Encounter Date: 07/31/2019  OT End of Session - 07/31/19 1707    Visit Number  1    Number of Visits  12    Date for OT Re-Evaluation  09/11/19    OT Start Time  1303    OT Stop Time  1400    OT Time Calculation (min)  57 min    Activity Tolerance  Patient tolerated treatment well    Behavior During Therapy  [facility] for tasks assessed/performed       Past Medical History:  Diagnosis Date  . Cancer ([place]) 07/2012   GI cancer,  . Personal history of chemotherapy    taking a break from chemo pill to see Dr in june 2020    Past Surgical History:  Procedure Laterality Date  . ABDOMINAL HYSTERECTOMY    . INCISION AND DRAINAGE ABSCESS Left 07/13/2019   Procedure: LEFT HAND INCISION AND DRAINAGE ABSCESS;  Surgeon: Hessie Knows, MD;  Location: [facility] ORS;  Service: Orthopedics;  Laterality: Left;    There were no vitals filed for this visit.  Subjective Assessment - 07/31/19 1655    Subjective   My thumb and fingers are stiff - cannot make fist or use my hand in activities - still some swelling and numbness now constant in my thumb to middle fingers    Pertinent History  Pt think abcess started after insect bite -  left hand incision and drainage date of surgery 07/13/2019. Iodoform packing was placed but not last appt . Culture shows Staphylococcus aureus. She is been taking Bactrim which shows to be sensitive and done iwth it now per pt  Pain and swelling much improved. Refer to hand therapy /OT    Patient Stated Goals  Want to get my motion and strength back in my L hand , and swelling as well as numbness that I can go back to work    Currently in Pain?  Yes    Pain Score  1      Pain Location  Hand    Pain Orientation  Left    Pain Descriptors / Indicators  Aching    Pain Type  Surgical pain    Pain Onset  1 to 4 weeks ago    Pain Frequency  Constant        [facility] OT Assessment - 07/31/19 0001      Assessment   Medical Diagnosis  I & D abscess L thumb thenar eminence     Referring Provider (OT)  Rudene Christians    Onset Date/Surgical Date  07/13/19    Hand Dominance  Right    Next MD Visit  --   08/02/2019     Home  Environment   Lives With  --   granddaughter lives with her      Prior Function   Vocation  Full time employment    Leisure  medication destributor , yard work , play with great grand baby , house work       Right Hand AROM   R Thumb MCP 0-60  60 Degrees    R Thumb IP 0-80  45 Degrees    R Thumb Radial ABduction/ADduction 0-55  50    R Thumb Palmar  ABduction/ADduction 0-45  63    R Thumb Opposition to Index  --   Opposition to 2nd digit     R Index  MCP 0-90  -30 Degrees   70   R Index PIP 0-100  -10 Degrees   80   R Long  MCP 0-90  -30 Degrees   75   R Long PIP 0-100  -10 Degrees   75   R Ring  MCP 0-90  -20 Degrees   65   R Ring PIP 0-100  -20 Degrees   80   R Little  MCP 0-90  -40 Degrees   65   R Little PIP 0-100  0 Degrees   70              OT Treatments/Exercises (OP) - 07/31/19 0001      LUE Contrast Bath   Time  9 minutes    Comments  prior to soft tissue and ROM      Review with pt HEP - and hand out provided :    Contrast  Soft tissue massage to thenar eminence , webspace and volar wrist prior to ROM   PROM to digits extention rolling over small foam roller  Tapping of digits  PROM for DIP/PIP flexion  Tendon glides AROM  -full fist to 2 cm foam roller 3 x day  Prayer stretch - in shower       OT Education - 07/31/19 1707    Education Details  findings of eval and HEP review       OT Short Term Goals - 07/31/19 1716      OT SHORT TERM GOAL #1   Title  Pt to be independent in HEP to  increase AROM in digits flexion , extention ,  thumb flexion  to use in more than 50 % of functional  tasks    Baseline  no knowledge in HEP , and functional use on PRWHE 47/50 - only 6% use    Time  3    Period  Weeks    Status  New    Target Date  08/21/19      OT SHORT TERM GOAL #2   Title  Numbness in L hand improve to less than 50% of the time - day and night time    Baseline  numbness since surgery constant in radial 3 digits    Time  4    Period  Weeks    Status  New    Target Date  08/28/19        OT Long Term Goals - 07/31/19 1718      OT LONG TERM GOAL #1   Title  L digits flexion improve to pt to touch palm to use hand in more than 75%  functional tasks    Baseline  only use 6% in functional tasks -and flexion in all digits impaired - see flowsheet    Time  6    Period  Weeks    Status  New    Target Date  09/11/19      OT LONG TERM GOAL #2   Title  L grip strength improve to more than 50 % compare to R hand    Baseline  2 1/2 wks s/p - NT    Time  6    Period  Weeks    Status  New    Target Date  09/11/19      OT LONG TERM GOAL #  3   Title  Function score on PREE improve with more than 30 points    Baseline  Function score on PREE ateval 47/50 , pain 7/50            Plan - 07/31/19 1708    Clinical Impression Statement  Pt present at OT eval 2 1/2 wks s/p I&D abscess in L thenar eminence - pt still with small scab , and increase edema- pt report she had some numbness at times in thumb thru 3rd digit - but numbness now constant - pt show decrease thumb flexion , opposition and digits flexion - decrease grip - limiting her functional use of L hand in ADL's and IADL's    OT Occupational Profile and History  Problem Focused Assessment - Including review of records relating to presenting problem    Occupational performance deficits (Please refer to evaluation for details):  ADL's;IADL's;Work;Play;Leisure;Social Participation    Body Structure / Function /  Physical Skills  ADL;Decreased knowledge of precautions;Flexibility;ROM;UE functional use;Scar mobility;FMC;Dexterity;Edema;Pain;Strength;IADL    Rehab Potential  Good    Clinical Decision Making  Limited treatment options, no task modification necessary    Comorbidities Affecting Occupational Performance:  None    Modification or Assistance to Complete Evaluation   No modification of tasks or assist necessary to complete eval    OT Frequency  2x / week    OT Duration  6 weeks    OT Treatment/Interventions  Self-care/ADL training;Therapeutic exercise;Patient/family education;Paraffin;Fluidtherapy;Contrast Bath;Manual Therapy;Passive range of motion;Scar mobilization    Plan  progress with  HEP and change as needed    OT Home Exercise Plan  see pt instruction    Consulted and Agree with Plan of Care  Patient       Patient will benefit from skilled therapeutic intervention in order to improve the following deficits and impairments:   Body Structure / Function / Physical Skills: ADL, Decreased knowledge of precautions, Flexibility, ROM, UE functional use, Scar mobility, FMC, Dexterity, Edema, Pain, Strength, IADL       Visit Diagnosis: Localized edema - Plan: Ot plan of care cert/re-cert  Stiffness of left hand, not elsewhere classified - Plan: Ot plan of care cert/re-cert  Pain in left hand - Plan: Ot plan of care cert/re-cert  Muscle weakness (generalized) - Plan: Ot plan of care cert/re-cert    Problem List There are no active problems to display for this patient.   Rosalyn Gess OTR/L,CLT 07/31/2019, 5:23 PM  [facility] [address], [place], [address] Phone: [phone]   Fax:  [phone]  Name: Tonya Brewer MRN: [id number] Date of Birth: Apr 07, 1956

## 2019-07-31 NOTE — Patient Instructions (Signed)
Contrast  Soft tissue massage to thenar eminence , webspace and volar wrist prior to ROM   PROM to digits extention rolling over small foam roller  Tapping of digits  PROM for DIP/PIP flexion  Tendon glides AROM  -full fist to 2 cm foam roller 3 x day  Prayer stretch - in shower

## 2019-08-03 ENCOUNTER — Ambulatory Visit: Payer: Medicare Other | Admitting: Occupational Therapy

## 2019-08-03 ENCOUNTER — Other Ambulatory Visit: Payer: Self-pay

## 2019-08-03 DIAGNOSIS — R600 Localized edema: Secondary | ICD-10-CM | POA: Diagnosis not present

## 2019-08-03 DIAGNOSIS — M25642 Stiffness of left hand, not elsewhere classified: Secondary | ICD-10-CM

## 2019-08-03 DIAGNOSIS — M79642 Pain in left hand: Secondary | ICD-10-CM

## 2019-08-03 DIAGNOSIS — M6281 Muscle weakness (generalized): Secondary | ICD-10-CM

## 2019-08-03 NOTE — Patient Instructions (Signed)
Same HEP - but add intrinsic stretch after PROM to DIP /PIP  And composite fist - can do PROM composite fist - prior to full fist AROM - but now to 2 cm foam block  And keep pain the same during session that she starts - do not increase

## 2019-08-03 NOTE — Therapy (Signed)
[facility] [address], [place], [address] Phone: [phone]   Fax:  [phone]  Occupational Therapy Treatment  Patient Details  Name: Tonya Brewer MRN: [id number] Date of Birth: 01-03-1956 Referring Provider (OT): Rudene Christians   Encounter Date: 08/03/2019  OT End of Session - 08/03/19 0919    Visit Number  2    Number of Visits  12    Date for OT Re-Evaluation  09/11/19    OT Start Time  0902    OT Stop Time  0943    OT Time Calculation (min)  41 min    Activity Tolerance  Patient tolerated treatment well    Behavior During Therapy  [facility] for tasks assessed/performed       Past Medical History:  Diagnosis Date  . Cancer ([place]) 07/2012   GI cancer,  . Personal history of chemotherapy    taking a break from chemo pill to see Dr in june 2020    Past Surgical History:  Procedure Laterality Date  . ABDOMINAL HYSTERECTOMY    . INCISION AND DRAINAGE ABSCESS Left 07/13/2019   Procedure: LEFT HAND INCISION AND DRAINAGE ABSCESS;  Surgeon: Hessie Knows, MD;  Location: [facility] ORS;  Service: Orthopedics;  Laterality: Left;    There were no vitals filed for this visit.  Subjective Assessment - 08/03/19 0914    Subjective   My fingers still numb in the tips - use to be down into my fingers, and cannot make fist - I think the swelling is better and the pain in my wrist making fist is better    Pertinent History  Pt think abcess started after insect bite -  left hand incision and drainage date of surgery 07/13/2019. Iodoform packing was placed but not last appt . Culture shows Staphylococcus aureus. She is been taking Bactrim which shows to be sensitive and done iwth it now per pt  Pain and swelling much improved. Refer to hand therapy /OT    Patient Stated Goals  Want to get my motion and strength back in my L hand , and swelling as well as numbness that I can go back to work    Currently in Pain?  Yes    Pain  Score  4     Pain Location  Hand    Pain Orientation  Left    Pain Type  Surgical pain    Pain Onset  1 to 4 weeks ago    Pain Frequency  Constant         [facility] OT Assessment - 08/03/19 0001      Left  Hand AROM   L Index  MCP 0-90  -20 Degrees   75   L Index PIP 0-100  --   80   L Long  MCP 0-90  -20 Degrees   70   L Long PIP 0-100  --   85   L Ring  MCP 0-90  -15 Degrees   75   L Ring PIP 0-100  80 Degrees    L Little  MCP 0-90  -30 Degrees   80   L Little PIP 0-100  --   70     Pt did show increase extention of digits this date - decrease edema and scar tissue in webspace  Cont to have stiffness in PIP and DIP's more than MC's during fisting  Pain in volar wrist and palm with composite fist - pain can  increase to 6/10  Numbness still in DIP of thumb thru 3rd  Ed on nerve healing           OT Treatments/Exercises (OP) - 08/03/19 0001      LUE Contrast Bath   Time  9 minutes    Comments  prior to soft tissue mobs       intrinsic stretch done during heat in contrast   soft tissue mobs to webspace and carpal and MC spread  Soft tissue massage to lateral bands of PIP 's  Scar massage - used vibration on scar and webspace fibrosis   PROM to DIP and PIP  PROM and AROM intrinsic fist  PROM composite flexion and add for pt  Place and hold to 2 cm foam block  With focus still on extention digits rolling over foam roller and tapping of digits  Keep pain same during session -        OT Education - 08/03/19 0919    Education Details  review HEP    Person(s) Educated  Patient    Methods  Explanation;Demonstration;Tactile cues;Verbal cues;Handout    Comprehension  Verbalized understanding;Returned demonstration       OT Short Term Goals - 07/31/19 1716      OT SHORT TERM GOAL #1   Title  Pt to be independent in HEP to increase AROM in digits flexion , extention ,  thumb flexion  to use in more than 50 % of functional  tasks    Baseline  no knowledge  in HEP , and functional use on PRWHE 47/50 - only 6% use    Time  3    Period  Weeks    Status  New    Target Date  08/21/19      OT SHORT TERM GOAL #2   Title  Numbness in L hand improve to less than 50% of the time - day and night time    Baseline  numbness since surgery constant in radial 3 digits    Time  4    Period  Weeks    Status  New    Target Date  08/28/19        OT Long Term Goals - 07/31/19 1718      OT LONG TERM GOAL #1   Title  L digits flexion improve to pt to touch palm to use hand in more than 75%  functional tasks    Baseline  only use 6% in functional tasks -and flexion in all digits impaired - see flowsheet    Time  6    Period  Weeks    Status  New    Target Date  09/11/19      OT LONG TERM GOAL #2   Title  L grip strength improve to more than 50 % compare to R hand    Baseline  2 1/2 wks s/p - NT    Time  6    Period  Weeks    Status  New    Target Date  09/11/19      OT LONG TERM GOAL #3   Title  Function score on PREE improve with more than 30 points    Baseline  Function score on PREE ateval 47/50 , pain 7/50            Plan - 08/03/19 0919    Clinical Impression Statement  Pt is 3 wks s/p I&D abscess in L thenar eminence -had 2nd session this  date - pt show increase extention of digits, edema in hand decrease and show increase 3rd digit AROM - cont to have scar tissue , increase edema and pain with fisting in volar wrist and digits- numbness now only in DIP's of thumb thru 3rd    OT Occupational Profile and History  Problem Focused Assessment - Including review of records relating to presenting problem    Occupational performance deficits (Please refer to evaluation for details):  ADL's;IADL's;Work;Play;Leisure;Social Participation    Body Structure / Function / Physical Skills  ADL;Decreased knowledge of precautions;Flexibility;ROM;UE functional use;Scar mobility;FMC;Dexterity;Edema;Pain;Strength;IADL    Rehab Potential  Good    Clinical  Decision Making  Limited treatment options, no task modification necessary    Comorbidities Affecting Occupational Performance:  None    Modification or Assistance to Complete Evaluation   No modification of tasks or assist necessary to complete eval    OT Frequency  2x / week    OT Duration  6 weeks    OT Treatment/Interventions  Self-care/ADL training;Therapeutic exercise;Patient/family education;Paraffin;Fluidtherapy;Contrast Bath;Manual Therapy;Passive range of motion;Scar mobilization    Plan  progress with  HEP and change as needed    OT Home Exercise Plan  see pt instruction    Consulted and Agree with Plan of Care  Patient       Patient will benefit from skilled therapeutic intervention in order to improve the following deficits and impairments:   Body Structure / Function / Physical Skills: ADL, Decreased knowledge of precautions, Flexibility, ROM, UE functional use, Scar mobility, FMC, Dexterity, Edema, Pain, Strength, IADL       Visit Diagnosis: Localized edema  Stiffness of left hand, not elsewhere classified  Pain in left hand  Muscle weakness (generalized)    Problem List There are no active problems to display for this patient.   Rosalyn Gess OTR/L,CLT 08/03/2019, 12:57 PM  [facility] [address] [place], [address] Phone: [phone]   Fax:  [phone]  Name: Ayaka Carrier MRN: [id number] Date of Birth: Jul 06, 1956

## 2019-08-08 ENCOUNTER — Ambulatory Visit: Payer: Medicare Other | Admitting: Occupational Therapy

## 2019-08-08 ENCOUNTER — Other Ambulatory Visit: Payer: Self-pay

## 2019-08-08 ENCOUNTER — Encounter: Payer: Self-pay | Admitting: Occupational Therapy

## 2019-08-08 DIAGNOSIS — R600 Localized edema: Secondary | ICD-10-CM | POA: Diagnosis not present

## 2019-08-08 DIAGNOSIS — M25642 Stiffness of left hand, not elsewhere classified: Secondary | ICD-10-CM

## 2019-08-08 DIAGNOSIS — M6281 Muscle weakness (generalized): Secondary | ICD-10-CM

## 2019-08-08 DIAGNOSIS — M79642 Pain in left hand: Secondary | ICD-10-CM

## 2019-08-08 NOTE — Therapy (Signed)
[facility] [address] [place], [address] Phone: [phone]   Fax:  [phone]  Occupational Therapy Treatment  Patient Details  Name: Tonya Brewer MRN: [id number] Date of Birth: 1956/08/06 Referring Provider (OT): Rudene Christians   Encounter Date: 08/08/2019  OT End of Session - 08/09/19 1858    Visit Number  3    Number of Visits  12    Date for OT Re-Evaluation  09/11/19    OT Start Time  0915    OT Stop Time  1000    OT Time Calculation (min)  45 min    Activity Tolerance  Patient tolerated treatment well    Behavior During Therapy  [facility] for tasks assessed/performed       Past Medical History:  Diagnosis Date  . Cancer ([place]) 07/2012   GI cancer,  . Personal history of chemotherapy    taking a break from chemo pill to see Dr in june 2020    Past Surgical History:  Procedure Laterality Date  . ABDOMINAL HYSTERECTOMY    . INCISION AND DRAINAGE ABSCESS Left 07/13/2019   Procedure: LEFT HAND INCISION AND DRAINAGE ABSCESS;  Surgeon: Hessie Knows, MD;  Location: [facility] ORS;  Service: Orthopedics;  Laterality: Left;    There were no vitals filed for this visit.  Subjective Assessment - 08/09/19 1858    Subjective   Patient reports she is able to do the exercises during the day but in the morning her hand feels so stiff.  She is worried about the swelling.  "She didn't tell me to but I did wrap my fingers to see if the swelling would go down."    Pertinent History  Pt think abcess started after insect bite -  left hand incision and drainage date of surgery 07/13/2019. Iodoform packing was placed but not last appt . Culture shows Staphylococcus aureus. She is been taking Bactrim which shows to be sensitive and done iwth it now per pt  Pain and swelling much improved. Refer to hand therapy /OT    Patient Stated Goals  Want to get my motion and strength back in my L hand , and swelling as well as numbness  that I can go back to work    Currently in Pain?  Yes    Pain Score  3     Pain Location  Hand    Pain Orientation  Left    Pain Descriptors / Indicators  Aching    Pain Type  Acute pain    Pain Onset  1 to 4 weeks ago    Pain Frequency  Intermittent        Following contrast for edema control, patient was seen for manual skills for soft tissue mobs with carpal and metacarpal spreads, web space and lateral bands of PIPs.  Scar massage performed with use of vibration in addition to manual techniques.   Use of graston tool nr2 on the volar side of the wrist Stretch to instrinsics passively followed by AROM,  PROM to DIP and PIP PROM composite flexion Use of foam block for place and hold, rolling over foam roller for extension.   Review of home exercise program.    Response to tx:   Patient continues to demonstrate stiffness with extension of digits, especially at PIPs and DIPs.  Pain decreased this date and able to tolerate use of graston tool.  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  Continue to work towards goals in plan of care to decrease pain, increase motion and functional use of hand and wrist to perform daily tasks.          OT Treatments/Exercises (OP) - 08/09/19 1902      LUE Contrast Bath   Time  11 minutes    Comments  prior to soft tissue and ROM             OT Education - 08/09/19 1857    Education Details  HEP, edema control    Person(s) Educated  Patient    Methods  Explanation;Demonstration;Tactile cues;Verbal cues;Handout    Comprehension  Verbalized understanding;Returned demonstration       OT Short Term Goals - 07/31/19 1716      OT SHORT TERM GOAL #1   Title  Pt to be independent in HEP to increase AROM in digits flexion , extention ,  thumb flexion  to use in more than 50 % of functional  tasks    Baseline  no knowledge in HEP , and  functional use on PRWHE 47/50 - only 6% use    Time  3    Period  Weeks    Status  New    Target Date  08/21/19      OT SHORT TERM GOAL #2   Title  Numbness in L hand improve to less than 50% of the time - day and night time    Baseline  numbness since surgery constant in radial 3 digits    Time  4    Period  Weeks    Status  New    Target Date  08/28/19        OT Long Term Goals - 07/31/19 1718      OT LONG TERM GOAL #1   Title  L digits flexion improve to pt to touch palm to use hand in more than 75%  functional tasks    Baseline  only use 6% in functional tasks -and flexion in all digits impaired - see flowsheet    Time  6    Period  Weeks    Status  New    Target Date  09/11/19      OT LONG TERM GOAL #2   Title  L grip strength improve to more than 50 % compare to R hand    Baseline  2 1/2 wks s/p - NT    Time  6    Period  Weeks    Status  New    Target Date  09/11/19      OT LONG TERM GOAL #3   Title  Function score on PREE improve with more than 30 points    Baseline  Function score on PREE ateval 47/50 , pain 7/50            Plan - 08/09/19 1859    Clinical Impression Statement  Patient continues to demonstrate stiffness with extension of digits, especially at PIPs and DIPs.  Pain decreased this date and able to tolerate use of graston tool.  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  Continue to work towards goals in plan of care to decrease pain, increase motion and functional use of hand and wrist to perform daily  tasks.    OT Occupational Profile and History  Problem Focused Assessment - Including review of records relating to presenting problem    Occupational performance deficits (Please refer to evaluation for details):  ADL's;IADL's;Work;Play;Leisure;Social Participation    Body Structure / Function / Physical Skills  ADL;Decreased knowledge of  precautions;Flexibility;ROM;UE functional use;Scar mobility;FMC;Dexterity;Edema;Pain;Strength;IADL    Rehab Potential  Good    Clinical Decision Making  Limited treatment options, no task modification necessary    Comorbidities Affecting Occupational Performance:  None    Modification or Assistance to Complete Evaluation   No modification of tasks or assist necessary to complete eval    OT Frequency  2x / week    OT Duration  6 weeks    OT Treatment/Interventions  Self-care/ADL training;Therapeutic exercise;Patient/family education;Paraffin;Fluidtherapy;Contrast Bath;Manual Therapy;Passive range of motion;Scar mobilization    Consulted and Agree with Plan of Care  Patient       Patient will benefit from skilled therapeutic intervention in order to improve the following deficits and impairments:   Body Structure / Function / Physical Skills: ADL, Decreased knowledge of precautions, Flexibility, ROM, UE functional use, Scar mobility, FMC, Dexterity, Edema, Pain, Strength, IADL       Visit Diagnosis: Muscle weakness (generalized)  Pain in left hand  Stiffness of left hand, not elsewhere classified  Localized edema    Problem List There are no active problems to display for this patient.   Oneita Jolly, OTR/L, CLT  , 08/09/2019, 7:13 PM  [place] [facility] [address] [place], [address] Phone: [phone]   Fax:  [phone]  Name: Irany Gallucci MRN: [id number] Date of Birth: 01-Apr-1956

## 2019-08-10 ENCOUNTER — Other Ambulatory Visit: Payer: Self-pay

## 2019-08-10 ENCOUNTER — Ambulatory Visit: Payer: Medicare Other | Attending: Orthopedic Surgery | Admitting: Occupational Therapy

## 2019-08-10 ENCOUNTER — Encounter: Payer: Self-pay | Admitting: Occupational Therapy

## 2019-08-10 DIAGNOSIS — M6281 Muscle weakness (generalized): Secondary | ICD-10-CM | POA: Diagnosis present

## 2019-08-10 DIAGNOSIS — M25642 Stiffness of left hand, not elsewhere classified: Secondary | ICD-10-CM | POA: Diagnosis present

## 2019-08-10 DIAGNOSIS — R600 Localized edema: Secondary | ICD-10-CM | POA: Insufficient documentation

## 2019-08-10 DIAGNOSIS — M79642 Pain in left hand: Secondary | ICD-10-CM | POA: Diagnosis present

## 2019-08-13 NOTE — Therapy (Signed)
[facility] [address], [place], [address] Phone: [phone]   Fax:  [phone]  Occupational Therapy Treatment  Patient Details  Name: Tonya Brewer MRN: [id number] Date of Birth: 1956-10-12 Referring Provider (OT): Rudene Christians   Encounter Date: 08/10/2019  OT End of Session - 08/13/19 1438    Visit Number  4    Number of Visits  12    Date for OT Re-Evaluation  09/11/19    OT Start Time  0947    OT Stop Time  1030    OT Time Calculation (min)  43 min    Activity Tolerance  Patient tolerated treatment well    Behavior During Therapy  [facility] for tasks assessed/performed       Past Medical History:  Diagnosis Date  . Cancer ([place]) 07/2012   GI cancer,  . Personal history of chemotherapy    taking a break from chemo pill to see Dr in june 2020    Past Surgical History:  Procedure Laterality Date  . ABDOMINAL HYSTERECTOMY    . INCISION AND DRAINAGE ABSCESS Left 07/13/2019   Procedure: LEFT HAND INCISION AND DRAINAGE ABSCESS;  Surgeon: Hessie Knows, MD;  Location: [facility] ORS;  Service: Orthopedics;  Laterality: Left;    There were no vitals filed for this visit.  Subjective Assessment - 08/13/19 1437    Subjective   Patient reports, "This is the best fist I have been able to make, look how good it looks!    Pertinent History  Pt think abcess started after insect bite -  left hand incision and drainage date of surgery 07/13/2019. Iodoform packing was placed but not last appt . Culture shows Staphylococcus aureus. She is been taking Bactrim which shows to be sensitive and done iwth it now per pt  Pain and swelling much improved. Refer to hand therapy /OT    Patient Stated Goals  Want to get my motion and strength back in my L hand , and swelling as well as numbness that I can go back to work    Currently in Pain?  Yes    Pain Score  3     Pain Location  Hand    Pain Orientation  Left    Pain  Descriptors / Indicators  Aching    Pain Type  Acute pain    Pain Onset  1 to 4 weeks ago    Pain Frequency  Intermittent         [facility] OT Assessment - 08/13/19 1439      Right Hand AROM   R Index  MCP 0-90  0 Degrees   (75)   R Index PIP 0-100  --   90   R Long  MCP 0-90  0 Degrees   (80)   R Long PIP 0-100  --   90   R Ring  MCP 0-90  0 Degrees   (70)   R Ring PIP 0-100  --   90   R Little  MCP 0-90  0 Degrees   (70)   R Little PIP 0-100  --   90       Measurements taken, see flowsheet above for details.  Full finger extension this date of digits with decreased tightness noted.    Manual Therapy: Following contrast for edema control, patient was seen for manual skills for soft tissue mobs with carpal and metacarpal spreads, web space and lateral bands of PIPs.  Scar massage performed with use of vibration in addition to manual techniques.   Use of graston tool nr2 on the volar side of the wrist  Therex:  Stretch to instrinsics passively followed by AROM,  PROM to DIP and PIP PROM composite flexion Tendon gliding exercises with cues from therapist, manual assist as needed Oppositional movements performed AROM for fisting, composite fisting, almost able to make a full fist this date. Use of foam block for place and hold, rolling over foam roller for extension.   Review of home exercise program.   Response to tx:  Patient making good progress this week and pleased with the reduction in edema and improved ROM for finger flexion and extension.  She was able to demonstrate full finger extension this date with decreased tightness noted.  Finger flexion and close to making a full fist this date.  Continued focus on improving ROM, decreased edema, decreased pain and increased use of hand for daily tasks.  Patient to continue with exercises over the weekend, cautioned her to not overwork the hand since she is really encouraged today and to monitor pain.         OT  Treatments/Exercises (OP) - 08/13/19 1444      LUE Contrast Bath   Time  11 minutes    Comments  prior to soft tissue and ROM             OT Education - 08/13/19 1438    Education Details  HEP, edema control    Person(s) Educated  Patient    Methods  Explanation;Demonstration;Tactile cues;Verbal cues;Handout    Comprehension  Verbalized understanding;Returned demonstration       OT Short Term Goals - 07/31/19 1716      OT SHORT TERM GOAL #1   Title  Pt to be independent in HEP to increase AROM in digits flexion , extention ,  thumb flexion  to use in more than 50 % of functional  tasks    Baseline  no knowledge in HEP , and functional use on PRWHE 47/50 - only 6% use    Time  3    Period  Weeks    Status  New    Target Date  08/21/19      OT SHORT TERM GOAL #2   Title  Numbness in L hand improve to less than 50% of the time - day and night time    Baseline  numbness since surgery constant in radial 3 digits    Time  4    Period  Weeks    Status  New    Target Date  08/28/19        OT Long Term Goals - 07/31/19 1718      OT LONG TERM GOAL #1   Title  L digits flexion improve to pt to touch palm to use hand in more than 75%  functional tasks    Baseline  only use 6% in functional tasks -and flexion in all digits impaired - see flowsheet    Time  6    Period  Weeks    Status  New    Target Date  09/11/19      OT LONG TERM GOAL #2   Title  L grip strength improve to more than 50 % compare to R hand    Baseline  2 1/2 wks s/p - NT    Time  6    Period  Weeks    Status  New  Target Date  09/11/19      OT LONG TERM GOAL #3   Title  Function score on PREE improve with more than 30 points    Baseline  Function score on PREE ateval 47/50 , pain 7/50            Plan - 08/13/19 1438    Clinical Impression Statement  Patient making good progress this week and pleased with the reduction in edema and improved ROM for finger flexion and extension.  She was  able to demonstrate full finger extension this date with decreased tightness noted.  Finger flexion and close to making a full fist this date.  Continued focus on improving ROM, decreased edema, decreased pain and increased use of hand for daily tasks.  Patient to continue with exercises over the weekend, cautioned her to not overwork the hand since she is really encouraged today and to monitor pain.    OT Occupational Profile and History  Problem Focused Assessment - Including review of records relating to presenting problem    Occupational performance deficits (Please refer to evaluation for details):  ADL's;IADL's;Work;Play;Leisure;Social Participation    Body Structure / Function / Physical Skills  ADL;Decreased knowledge of precautions;Flexibility;ROM;UE functional use;Scar mobility;FMC;Dexterity;Edema;Pain;Strength;IADL    Rehab Potential  Good    Clinical Decision Making  Limited treatment options, no task modification necessary    Comorbidities Affecting Occupational Performance:  None    Modification or Assistance to Complete Evaluation   No modification of tasks or assist necessary to complete eval    OT Frequency  2x / week    OT Duration  6 weeks    OT Treatment/Interventions  Self-care/ADL training;Therapeutic exercise;Patient/family education;Paraffin;Fluidtherapy;Contrast Bath;Manual Therapy;Passive range of motion;Scar mobilization    Consulted and Agree with Plan of Care  Patient       Patient will benefit from skilled therapeutic intervention in order to improve the following deficits and impairments:   Body Structure / Function / Physical Skills: ADL, Decreased knowledge of precautions, Flexibility, ROM, UE functional use, Scar mobility, FMC, Dexterity, Edema, Pain, Strength, IADL       Visit Diagnosis: Muscle weakness (generalized)  Pain in left hand  Stiffness of left hand, not elsewhere classified  Localized edema    Problem List There are no active problems to  display for this patient.   Oneita Jolly, OTR/L, CLT  , 08/13/2019, 2:52 PM  [facility] [address] [place], [address] Phone: [phone]   Fax:  [phone]  Name: Molinda Tyra MRN: [id number] Date of Birth: 29-Mar-1956

## 2019-08-17 ENCOUNTER — Ambulatory Visit: Payer: Medicare Other | Admitting: Occupational Therapy

## 2019-08-17 ENCOUNTER — Other Ambulatory Visit: Payer: Self-pay

## 2019-08-17 DIAGNOSIS — M25642 Stiffness of left hand, not elsewhere classified: Secondary | ICD-10-CM

## 2019-08-17 DIAGNOSIS — M6281 Muscle weakness (generalized): Secondary | ICD-10-CM

## 2019-08-17 DIAGNOSIS — M79642 Pain in left hand: Secondary | ICD-10-CM

## 2019-08-17 DIAGNOSIS — R600 Localized edema: Secondary | ICD-10-CM

## 2019-08-17 NOTE — Therapy (Signed)
[place] [facility] [address] [place], [address] Phone: [phone]   Fax:  [phone]  Occupational Therapy Treatment  Patient Details  Name: Tonya Brewer MRN: [id number] Date of Birth: 06/23/1956 Referring Provider (OT): Rudene Christians   Encounter Date: 08/17/2019  OT End of Session - 08/17/19 1017    Visit Number  5    Number of Visits  12    Date for OT Re-Evaluation  09/11/19    OT Start Time  0949    OT Stop Time  1035    OT Time Calculation (min)  46 min    Activity Tolerance  Patient tolerated treatment well    Behavior During Therapy  [facility] for tasks assessed/performed       Past Medical History:  Diagnosis Date  . Cancer ([place]) 07/2012   GI cancer,  . Personal history of chemotherapy    taking a break from chemo pill to see Dr in june 2020    Past Surgical History:  Procedure Laterality Date  . ABDOMINAL HYSTERECTOMY    . INCISION AND DRAINAGE ABSCESS Left 07/13/2019   Procedure: LEFT HAND INCISION AND DRAINAGE ABSCESS;  Surgeon: Hessie Knows, MD;  Location: [facility] ORS;  Service: Orthopedics;  Laterality: Left;    There were no vitals filed for this visit.  Subjective Assessment - 08/17/19 1014    Subjective   Doing better - but slow - numbness still in tip of thumb , and index/ middle finger - using it more - but still weak - can touch my palm    Pertinent History  Pt think abcess started after insect bite -  left hand incision and drainage date of surgery 07/13/2019. Iodoform packing was placed but not last appt . Culture shows Staphylococcus aureus. She is been taking Bactrim which shows to be sensitive and done iwth it now per pt  Pain and swelling much improved. Refer to hand therapy /OT    Patient Stated Goals  Want to get my motion and strength back in my L hand , and swelling as well as numbness that I can go back to work    Currently in Pain?  No/denies         [facility] OT Assessment -  08/17/19 0001      Strength   Right Hand Grip (lbs)  50    Right Hand Lateral Pinch  12 lbs    Right Hand 3 Point Pinch  3 lbs    Left Hand Grip (lbs)  15    Left Hand Lateral Pinch  3 lbs    Left Hand 3 Point Pinch  6 lbs      Right Hand AROM   R Index  MCP 0-90  80 Degrees    R Index PIP 0-100  100 Degrees    R Long  MCP 0-90  90 Degrees    R Long PIP 0-100  100 Degrees    R Ring  MCP 0-90  90 Degrees    R Ring PIP 0-100  100 Degrees    R Little  MCP 0-90  85 Degrees    R Little PIP 0-100  95 Degrees         Measurements taken, see flowsheet above for details.  Full finger extension this date but intrinsic fist tightness -decrease DIP flexion   Thornell Mule test for sensation done - see assessment      OT Treatments/Exercises (OP) - 08/17/19 0001  LUE Contrast Bath   Time  9 minutes    Comments  intrinsic fist stretch during head        Following contrast for edema control, patient was seen for manual skills for soft tissue mobs with carpal and metacarpal spreads, web space and lateral bands of PIPs. Scar massage performed with use of vibration in addition to manual techniques.  Use of graston tool nr2 on the volar side of the wrist   focus on intrinsic fist stretch during contrast  AROM intrinsic fist  And Place and hold  Composite fist to palm    Add light blue putty gripping , lat and 3 point grip - but should have no pain on volar wrist Rolling in between for flexors       OT Education - 08/17/19 1016    Education Details  add intrinsic fist stretch -and putty    Person(s) Educated  Patient    Methods  Explanation;Demonstration;Tactile cues;Verbal cues;Handout    Comprehension  Verbalized understanding;Returned demonstration       OT Short Term Goals - 07/31/19 1716      OT SHORT TERM GOAL #1   Title  Pt to be independent in HEP to increase AROM in digits flexion , extention ,  thumb flexion  to use in more than 50 % of functional  tasks     Baseline  no knowledge in HEP , and functional use on PRWHE 47/50 - only 6% use    Time  3    Period  Weeks    Status  New    Target Date  08/21/19      OT SHORT TERM GOAL #2   Title  Numbness in L hand improve to less than 50% of the time - day and night time    Baseline  numbness since surgery constant in radial 3 digits    Time  4    Period  Weeks    Status  New    Target Date  08/28/19        OT Long Term Goals - 07/31/19 1718      OT LONG TERM GOAL #1   Title  L digits flexion improve to pt to touch palm to use hand in more than 75%  functional tasks    Baseline  only use 6% in functional tasks -and flexion in all digits impaired - see flowsheet    Time  6    Period  Weeks    Status  New    Target Date  09/11/19      OT LONG TERM GOAL #2   Title  L grip strength improve to more than 50 % compare to R hand    Baseline  2 1/2 wks s/p - NT    Time  6    Period  Weeks    Status  New    Target Date  09/11/19      OT LONG TERM GOAL #3   Title  Function score on PREE improve with more than 30 points    Baseline  Function score on PREE ateval 47/50 , pain 7/50            Plan - 08/17/19 1017    Clinical Impression Statement  Pt making good progress in AROM at [facility] and PIP flexion , extention - but still decrease in intrinsic fist and grip /prehension strength - assess this date Thornell Mule - able to feel 4.56 on all digits-  but uanble to do 3.61 on middle and distal phalanges on 3rd , thumb not middle and IP , and unable volar 2nd digit    OT Occupational Profile and History  Problem Focused Assessment - Including review of records relating to presenting problem    Occupational performance deficits (Please refer to evaluation for details):  ADL's;IADL's;Work;Play;Leisure;Social Participation    Body Structure / Function / Physical Skills  ADL;Decreased knowledge of precautions;Flexibility;ROM;UE functional use;Scar mobility;FMC;Dexterity;Edema;Pain;Strength;IADL     Rehab Potential  Good    Clinical Decision Making  Limited treatment options, no task modification necessary    Comorbidities Affecting Occupational Performance:  None    Modification or Assistance to Complete Evaluation   No modification of tasks or assist necessary to complete eval    OT Frequency  2x / week    OT Duration  6 weeks    OT Treatment/Interventions  Self-care/ADL training;Therapeutic exercise;Patient/family education;Paraffin;Fluidtherapy;Contrast Bath;Manual Therapy;Passive range of motion;Scar mobilization    Plan  progress with  HEP and change as needed    OT Home Exercise Plan  see pt instruction    Consulted and Agree with Plan of Care  Patient       Patient will benefit from skilled therapeutic intervention in order to improve the following deficits and impairments:   Body Structure / Function / Physical Skills: ADL, Decreased knowledge of precautions, Flexibility, ROM, UE functional use, Scar mobility, FMC, Dexterity, Edema, Pain, Strength, IADL       Visit Diagnosis: Muscle weakness (generalized)  Pain in left hand  Stiffness of left hand, not elsewhere classified  Localized edema    Problem List There are no active problems to display for this patient.   Rosalyn Gess OTR/L,CLT 08/17/2019, 1:22 PM  [place] [facility] [address] [place], [address] Phone: [phone]   Fax:  [phone]  Name: Tonya Brewer MRN: [id number] Date of Birth: 1956-08-24

## 2019-08-17 NOTE — Patient Instructions (Signed)
Pt to focus on intrinsic fist stretch during contrast  AROM intrinsic fist  Composite fist   Add light blue putty gripping , lat and 3 point grip - but no pain on volar wrist Rolling inbetween

## 2019-08-21 ENCOUNTER — Ambulatory Visit: Payer: Medicare Other | Admitting: Occupational Therapy

## 2019-08-21 ENCOUNTER — Other Ambulatory Visit: Payer: Self-pay

## 2019-08-21 DIAGNOSIS — M6281 Muscle weakness (generalized): Secondary | ICD-10-CM

## 2019-08-21 DIAGNOSIS — M79642 Pain in left hand: Secondary | ICD-10-CM

## 2019-08-21 DIAGNOSIS — R600 Localized edema: Secondary | ICD-10-CM

## 2019-08-21 DIAGNOSIS — M25642 Stiffness of left hand, not elsewhere classified: Secondary | ICD-10-CM

## 2019-08-21 NOTE — Patient Instructions (Signed)
Same - putty and PA /RA with rubber band - 3 sets of 10-12  2 x day

## 2019-08-21 NOTE — Therapy (Signed)
[place] [facility] [address] [place], [address] Phone: [phone]   Fax:  [phone]  Occupational Therapy Treatment  Patient Details  Name: Tonya Brewer MRN: [id number] Date of Birth: 1956-01-25 Referring Provider (OT): Rudene Christians   Encounter Date: 08/21/2019  OT End of Session - 08/21/19 1132    Visit Number  6    Number of Visits  12    Date for OT Re-Evaluation  09/11/19    OT Start Time  1030    OT Stop Time  1114    OT Time Calculation (min)  44 min    Activity Tolerance  Patient tolerated treatment well    Behavior During Therapy  [facility] for tasks assessed/performed       Past Medical History:  Diagnosis Date  . Cancer ([place]) 07/2012   GI cancer,  . Personal history of chemotherapy    taking a break from chemo pill to see Dr in june 2020    Past Surgical History:  Procedure Laterality Date  . ABDOMINAL HYSTERECTOMY    . INCISION AND DRAINAGE ABSCESS Left 07/13/2019   Procedure: LEFT HAND INCISION AND DRAINAGE ABSCESS;  Surgeon: Hessie Knows, MD;  Location: [facility] ORS;  Service: Orthopedics;  Laterality: Left;    There were no vitals filed for this visit.  Subjective Assessment - 08/21/19 1044    Subjective   It feels about the same - numbness -but I did the putty - my grand daughter did it too with her own    Pertinent History  Pt think abcess started after insect bite -  left hand incision and drainage date of surgery 07/13/2019. Iodoform packing was placed but not last appt . Culture shows Staphylococcus aureus. She is been taking Bactrim which shows to be sensitive and done iwth it now per pt  Pain and swelling much improved. Refer to hand therapy /OT    Patient Stated Goals  Want to get my motion and strength back in my L hand , and swelling as well as numbness that I can go back to work    Currently in Pain?  Yes    Pain Score  2     Pain Location  Hand    Pain Orientation  Left    Pain Descriptors / Indicators  Aching    Pain Type  Acute pain;Surgical pain    Pain Onset  More than a month ago         [facility] OT Assessment - 08/21/19 0001      Strength   Right Hand Grip (lbs)  50    Right Hand Lateral Pinch  12 lbs    Right Hand 3 Point Pinch  3 lbs    Left Hand Grip (lbs)  19    Left Hand Lateral Pinch  15 lbs    Left Hand 3 Point Pinch  6 lbs       Grip and lateral grip increase in L hand since last time - had some pain over volar wrist- pt to stop when feeling pull or strain more than 2-3/10          OT Treatments/Exercises (OP) - 08/21/19 0001      LUE Contrast Bath   Time  9 minutes    Comments  intrinsic fist stretch in heat -        Following contrast for edema control, patient was seen for manual skills for soft tissue mobs with carpal  and metacarpal spreads, web space and lateral bands of PIPs. Scar massage performed manual techniques.  Use of graston tool nr2 on the volar side of the wrist, forearm    focus on intrinsic fist stretch during contrast  AROM intrinsic fist  , AAROM and  And Place and hold  Composite fist to palm    Review  light blue putty gripping , lat and 3 point grip - but should have no pain on volar wrist- 3 sets of 12 with rolling putty alternating Rubber band for PA and RA of thumb -needed assistance to PA - pt was repeating RA in different plane  3 x 12 reps each            OT Short Term Goals - 07/31/19 1716      OT SHORT TERM GOAL #1   Title  Pt to be independent in HEP to increase AROM in digits flexion , extention ,  thumb flexion  to use in more than 50 % of functional  tasks    Baseline  no knowledge in HEP , and functional use on PRWHE 47/50 - only 6% use    Time  3    Period  Weeks    Status  New    Target Date  08/21/19      OT SHORT TERM GOAL #2   Title  Numbness in L hand improve to less than 50% of the time - day and night time    Baseline  numbness since surgery constant in  radial 3 digits    Time  4    Period  Weeks    Status  New    Target Date  08/28/19        OT Long Term Goals - 07/31/19 1718      OT LONG TERM GOAL #1   Title  L digits flexion improve to pt to touch palm to use hand in more than 75%  functional tasks    Baseline  only use 6% in functional tasks -and flexion in all digits impaired - see flowsheet    Time  6    Period  Weeks    Status  New    Target Date  09/11/19      OT LONG TERM GOAL #2   Title  L grip strength improve to more than 50 % compare to R hand    Baseline  2 1/2 wks s/p - NT    Time  6    Period  Weeks    Status  New    Target Date  09/11/19      OT LONG TERM GOAL #3   Title  Function score on PREE improve with more than 30 points    Baseline  Function score on PREE ateval 47/50 , pain 7/50            Plan - 08/21/19 1133    Clinical Impression Statement  Pt made progress in composite fist with less pain - and increase grip / and lat grip using putty over the weekend- cont to increase strength and composite fist with out increasing pain and numbness    OT Occupational Profile and History  Problem Focused Assessment - Including review of records relating to presenting problem    Occupational performance deficits (Please refer to evaluation for details):  ADL's;IADL's;Work;Play;Leisure;Social Participation    Body Structure / Function / Physical Skills  ADL;Decreased knowledge of precautions;Flexibility;ROM;UE functional use;Scar mobility;FMC;Dexterity;Edema;Pain;Strength;IADL    Rehab  Potential  Good    Clinical Decision Making  Limited treatment options, no task modification necessary    Comorbidities Affecting Occupational Performance:  None    Modification or Assistance to Complete Evaluation   No modification of tasks or assist necessary to complete eval    OT Frequency  2x / week    OT Duration  6 weeks    OT Treatment/Interventions  Self-care/ADL training;Therapeutic exercise;Patient/family  education;Paraffin;Fluidtherapy;Contrast Bath;Manual Therapy;Passive range of motion;Scar mobilization    Plan  progress with  HEP and change as needed    OT Home Exercise Plan  see pt instruction    Consulted and Agree with Plan of Care  Patient       Patient will benefit from skilled therapeutic intervention in order to improve the following deficits and impairments:   Body Structure / Function / Physical Skills: ADL, Decreased knowledge of precautions, Flexibility, ROM, UE functional use, Scar mobility, FMC, Dexterity, Edema, Pain, Strength, IADL       Visit Diagnosis: Muscle weakness (generalized)  Pain in left hand  Stiffness of left hand, not elsewhere classified  Localized edema    Problem List There are no active problems to display for this patient.   Rosalyn Gess OTR/L,CLT 08/21/2019, 11:35 AM  [place] [facility] [address], [place], [address] Phone: [phone]   Fax:  [phone]  Name: Tonya Brewer MRN: [id number] Date of Birth: [birth year]

## 2019-08-24 ENCOUNTER — Other Ambulatory Visit: Payer: Self-pay

## 2019-08-24 ENCOUNTER — Ambulatory Visit: Payer: Medicare Other | Admitting: Occupational Therapy

## 2019-08-24 DIAGNOSIS — M79642 Pain in left hand: Secondary | ICD-10-CM

## 2019-08-24 DIAGNOSIS — M25642 Stiffness of left hand, not elsewhere classified: Secondary | ICD-10-CM

## 2019-08-24 DIAGNOSIS — M6281 Muscle weakness (generalized): Secondary | ICD-10-CM

## 2019-08-24 DIAGNOSIS — R600 Localized edema: Secondary | ICD-10-CM

## 2019-08-24 NOTE — Therapy (Signed)
[facility] [address], [place], [address] Phone: [phone]   Fax:  [phone]  Occupational Therapy Treatment  Patient Details  Name: Tonya Brewer MRN: [id number] Date of Birth: [birth year] Referring Provider (OT): Rudene Christians   Encounter Date: 08/24/2019  OT End of Session - 08/24/19 0927    Visit Number  7    Number of Visits  12    Date for OT Re-Evaluation  09/11/19    OT Start Time  0730    OT Stop Time  0816    OT Time Calculation (min)  46 min    Activity Tolerance  Patient tolerated treatment well    Behavior During Therapy  [facility] for tasks assessed/performed       Past Medical History:  Diagnosis Date  . Cancer ([place]) 07/2012   GI cancer,  . Personal history of chemotherapy    taking a break from chemo pill to see Dr in june 2020    Past Surgical History:  Procedure Laterality Date  . ABDOMINAL HYSTERECTOMY    . INCISION AND DRAINAGE ABSCESS Left 07/13/2019   Procedure: LEFT HAND INCISION AND DRAINAGE ABSCESS;  Surgeon: Hessie Knows, MD;  Location: [facility] ORS;  Service: Orthopedics;  Laterality: Left;    There were no vitals filed for this visit.  Subjective Assessment - 08/24/19 0742    Subjective   Done okay - can make better fist - and going back to work on Monday    Pertinent History  Pt think abcess started after insect bite -  left hand incision and drainage date of surgery 07/13/2019. Iodoform packing was placed but not last appt . Culture shows Staphylococcus aureus. She is been taking Bactrim which shows to be sensitive and done iwth it now per pt  Pain and swelling much improved. Refer to hand therapy /OT    Currently in Pain?  No/denies         [facility] OT Assessment - 08/24/19 0001      Strength   Left Hand Grip (lbs)  25      Semmes-Weinstein -able to feel this date on thumb to IP 3.61  But still 4.56 on volar 2nd and 3rd from middle to tip  Cont to show progress  in sensation             OT Treatments/Exercises (OP) - 08/24/19 0001      LUE Paraffin   Number Minutes Paraffin  8 Minutes    LUE Paraffin Location  Hand    Comments  prior to soft tissue mobs and PROM          patient was seen for manual skills for soft tissue mobs with carpal and metacarpal spreads, web space and lateral bands of PIPs. Scar massage performed manual techniques and vibration this date  Use of graston tool nr2 on the volar side of the wrist, forearm   focus on intrinsic fist stretch  AROM intrinsic fist , AAROM and And Place and hold Composite fistto palm   Upgrade HEP to teal  putty gripping , lat and 3 point grip - butshould haveno pain on volar wrist-  Decrease to 1-2 sets for  12 with rolling putty alternating Cont with Rubber band for PA and RA of thumb -needed assistance to PA - pt was repeating RA in different plane  3 x 12 reps each         OT Education - 08/24/19 [id number]  Education Details  progress , and [facility] /upgrade putty    Person(s) Educated  Patient    Methods  Explanation;Demonstration;Tactile cues;Verbal cues;Handout    Comprehension  Verbalized understanding;Returned demonstration       OT Short Term Goals - 07/31/19 1716      OT SHORT TERM GOAL #1   Title  Pt to be independent in HEP to increase AROM in digits flexion , extention ,  thumb flexion  to use in more than 50 % of functional  tasks    Baseline  no knowledge in HEP , and functional use on PRWHE 47/50 - only 6% use    Time  3    Period  Weeks    Status  New    Target Date  08/21/19      OT SHORT TERM GOAL #2   Title  Numbness in L hand improve to less than 50% of the time - day and night time    Baseline  numbness since surgery constant in radial 3 digits    Time  4    Period  Weeks    Status  New    Target Date  08/28/19        OT Long Term Goals - 07/31/19 1718      OT LONG TERM GOAL #1   Title  L digits flexion improve to pt to touch  palm to use hand in more than 75%  functional tasks    Baseline  only use 6% in functional tasks -and flexion in all digits impaired - see flowsheet    Time  6    Period  Weeks    Status  New    Target Date  09/11/19      OT LONG TERM GOAL #2   Title  L grip strength improve to more than 50 % compare to R hand    Baseline  2 1/2 wks s/p - NT    Time  6    Period  Weeks    Status  New    Target Date  09/11/19      OT LONG TERM GOAL #3   Title  Function score on PREE improve with more than 30 points    Baseline  Function score on PREE ateval 47/50 , pain 7/50            Plan - 08/24/19 0928    Clinical Impression Statement  Pt made progress since last week in sensation in thumb - now a 3.61 on thumb IP too - but still decrease sensation on middle phalanges 3rd and distal , 2nd proximal phalanges to tip - pt to return to work on Monday -add this date some [facility] /dexterity to do over weekend    OT Occupational Profile and History  Problem Focused Assessment - Including review of records relating to presenting problem    Occupational performance deficits (Please refer to evaluation for details):  ADL's;IADL's;Work;Play;Leisure;Social Participation    Body Structure / Function / Physical Skills  ADL;Decreased knowledge of precautions;Flexibility;ROM;UE functional use;Scar mobility;FMC;Dexterity;Edema;Pain;Strength;IADL    Rehab Potential  Good    Clinical Decision Making  Limited treatment options, no task modification necessary    Comorbidities Affecting Occupational Performance:  None    Modification or Assistance to Complete Evaluation   No modification of tasks or assist necessary to complete eval    OT Frequency  2x / week    OT Duration  6 weeks    OT Treatment/Interventions  Self-care/ADL training;Therapeutic exercise;Patient/family education;Paraffin;Fluidtherapy;Contrast Bath;Manual Therapy;Passive range of motion;Scar mobilization    Plan  progress with  HEP and change as  needed    OT Home Exercise Plan  see pt instruction    Consulted and Agree with Plan of Care  Patient       Patient will benefit from skilled therapeutic intervention in order to improve the following deficits and impairments:   Body Structure / Function / Physical Skills: ADL, Decreased knowledge of precautions, Flexibility, ROM, UE functional use, Scar mobility, FMC, Dexterity, Edema, Pain, Strength, IADL       Visit Diagnosis: Muscle weakness (generalized)  Pain in left hand  Stiffness of left hand, not elsewhere classified  Localized edema    Problem List There are no active problems to display for this patient.   Rosalyn Gess OTR/L,CLT 08/24/2019, 9:32 AM  [facility] [address], [place], [address] Phone: [phone]   Fax:  [phone]  Name: Tahjanae Kinzer MRN: [id number] Date of Birth: Jul 02, 1956

## 2019-08-24 NOTE — Patient Instructions (Signed)
Upgrade putty to med teal for gripping , lat and 3 point grip  But decrease sets to 1 or 2  And pain free

## 2019-08-31 ENCOUNTER — Ambulatory Visit: Payer: Medicare Other | Admitting: Occupational Therapy

## 2019-08-31 ENCOUNTER — Other Ambulatory Visit: Payer: Self-pay

## 2019-08-31 DIAGNOSIS — M79642 Pain in left hand: Secondary | ICD-10-CM

## 2019-08-31 DIAGNOSIS — M25642 Stiffness of left hand, not elsewhere classified: Secondary | ICD-10-CM

## 2019-08-31 DIAGNOSIS — M6281 Muscle weakness (generalized): Secondary | ICD-10-CM | POA: Diagnosis not present

## 2019-08-31 DIAGNOSIS — R600 Localized edema: Secondary | ICD-10-CM

## 2019-08-31 NOTE — Therapy (Signed)
[facility] [address] [place], [address] Phone: [phone]   Fax:  [phone]  Occupational Therapy Treatment  Patient Details  Name: Tonya Brewer MRN: [id number] Date of Birth: [birth year] Referring Provider (OT): Rudene Christians   Encounter Date: 08/31/2019  OT End of Session - 08/31/19 0839    Visit Number  8    Number of Visits  12    Date for OT Re-Evaluation  09/11/19    OT Start Time  0817    OT Stop Time  0901    OT Time Calculation (min)  44 min    Activity Tolerance  Patient tolerated treatment well    Behavior During Therapy  [facility] for tasks assessed/performed       Past Medical History:  Diagnosis Date  . Cancer ([place]) 07/2012   GI cancer,  . Personal history of chemotherapy    taking a break from chemo pill to see Dr in june 2020    Past Surgical History:  Procedure Laterality Date  . ABDOMINAL HYSTERECTOMY    . INCISION AND DRAINAGE ABSCESS Left 07/13/2019   Procedure: LEFT HAND INCISION AND DRAINAGE ABSCESS;  Surgeon: Hessie Knows, MD;  Location: [facility] ORS;  Service: Orthopedics;  Laterality: Left;    There were no vitals filed for this visit.  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 Iodoform packing was placed but not last appt . Culture shows Staphylococcus aureus. She is been taking Bactrim which shows to be sensitive and done iwth it now per pt  Pain and swelling much improved. Refer to hand therapy /OT    Patient Stated Goals  Want to get my motion and strength back in my L hand , and swelling as well as numbness that I can go back to work    Currently in Pain?  No/denies          [facility] OT Assessment - 08/31/19 0001      Strength   Right Hand Grip (lbs)  50    Right Hand Lateral Pinch  12 lbs    Right Hand 3 Point Pinch  3 lbs    Left Hand Grip (lbs)  29    Left Hand Lateral Pinch  5 lbs    Left Hand 3 Point Pinch  3 lbs       Semmes-Weinstein -able to feel this date on thumb to IP  And middle phalanges of 3rd and 4th the 3.61  4.56 on DIP of  2nd and 3rd   Cont to show progress in sensation          OT Treatments/Exercises (OP) - 08/31/19 0001      LUE Paraffin   Number Minutes Paraffin  8 Minutes    LUE Paraffin Location  Hand    Comments  Prior to soft tissue          patient was seen for manual skills for soft tissue mobs with carpal and metacarpal spreads, web space and lateral bands of PIPs. Scar massage performed manual techniques  Use of graston tool nr2 on the volar side of  the wrist, forearmsweeping with tool nr 2   focus on intrinsic fist stretch  AROM intrinsic fist, AAROM andAnd Place and hold Composite fistto palm   Upgrade HEP to green putty gripping , lat and 3 point grip - butshould haveno pain on volar wrist-  Decrease to 1-2 sets for  12 with rolling putty alternating Cont with Rubber band for PA and RA of thumb -needed assistance to PA - pt was repeating RA in different plane  3 x 12 reps each     OT Education - 08/31/19 0839    Education Details  progress and upgrade in HEP    Person(s) Educated  Patient    Methods  Explanation;Demonstration;Tactile cues;Verbal cues;Handout    Comprehension  Verbalized understanding;Returned demonstration       OT Short Term Goals - 07/31/19 1716      OT SHORT TERM GOAL #1   Title  Pt to be independent in HEP to increase AROM in digits flexion , extention ,  thumb flexion  to use in more than 50 % of functional  tasks    Baseline  no knowledge in HEP , and functional use on PRWHE 47/50 - only 6% use    Time  3    Period  Weeks    Status  New    Target  Date  08/21/19      OT SHORT TERM GOAL #2   Title  Numbness in L hand improve to less than 50% of the time - day and night time    Baseline  numbness since surgery constant in radial 3 digits    Time  4    Period  Weeks    Status  New    Target Date  08/28/19        OT Long Term Goals - 07/31/19 1718      OT LONG TERM GOAL #1   Title  L digits flexion improve to pt to touch palm to use hand in more than 75%  functional tasks    Baseline  only use 6% in functional tasks -and flexion in all digits impaired - see flowsheet    Time  6    Period  Weeks    Status  New    Target Date  09/11/19      OT LONG TERM GOAL #2   Title  L grip strength improve to more than 50 % compare to R hand    Baseline  2 1/2 wks s/p - NT    Time  6    Period  Weeks    Status  New    Target Date  09/11/19      OT LONG TERM GOAL #3   Title  Function score on PREE improve with more than 30 points    Baseline  Function score on PREE ateval 47/50 , pain 7/50            Plan - 08/31/19 0841    Clinical Impression Statement  Pt made progress in grip strength and sensation - Semmes weinstein  is now 3.61 on thumb IP and end and 3rd digit middle phalanges - 4.56 on DIP of 3rd and 4th - upgrade her putty and pt to cont to focus on intrinsic fist and composite fist    OT Occupational Profile and History  Problem Focused Assessment - Including review of records relating to presenting problem    Occupational performance deficits (Please refer to evaluation for details):  ADL's;IADL's;Work;Play;Leisure;Social Participation    Body Structure / Function / Physical Skills  ADL;Decreased knowledge of precautions;Flexibility;ROM;UE functional use;Scar mobility;FMC;Dexterity;Edema;Pain;Strength;IADL    Rehab Potential  Good    Clinical Decision Making  Limited treatment options, no task modification necessary    Comorbidities Affecting Occupational Performance:  None    Modification or Assistance to Complete  Evaluation   No modification of tasks or assist necessary to complete eval    OT Frequency  1x / week    OT Duration  6 weeks    OT Treatment/Interventions  Self-care/ADL training;Therapeutic exercise;Patient/family education;Paraffin;Fluidtherapy;Contrast Bath;Manual Therapy;Passive range of motion;Scar mobilization    Plan  progress with  HEP assess and sensation assess    OT Home Exercise Plan  see pt instruction    Consulted and Agree with Plan of Care  Patient       Patient will benefit from skilled therapeutic intervention in order to improve the following deficits and impairments:   Body Structure / Function / Physical Skills: ADL, Decreased knowledge of precautions, Flexibility, ROM, UE functional use, Scar mobility, FMC, Dexterity, Edema, Pain, Strength, IADL       Visit Diagnosis: Muscle weakness (generalized)  Pain in left hand  Stiffness of left hand, not elsewhere classified  Localized edema    Problem List There are no active problems to display for this patient.   Rosalyn Gess  OTR/L,CLT 08/31/2019, 9:25 AM  [facility] [address], [place], [address] Phone: [phone]   Fax:  [phone]  Name: Henryetta Guastella MRN: [id number] Date of Birth: [birth year]

## 2019-08-31 NOTE — Patient Instructions (Signed)
Upgrade putty to green - decrease to 1 set - 2 x day  And increase gradually over the week  Cont to focus on intrinsic fist and composite   And joint protection - avoiding radial deviation of digits

## 2019-09-07 ENCOUNTER — Other Ambulatory Visit: Payer: Self-pay

## 2019-09-07 ENCOUNTER — Ambulatory Visit: Payer: Medicare Other | Admitting: Occupational Therapy

## 2019-09-07 DIAGNOSIS — M6281 Muscle weakness (generalized): Secondary | ICD-10-CM | POA: Diagnosis not present

## 2019-09-07 DIAGNOSIS — R600 Localized edema: Secondary | ICD-10-CM

## 2019-09-07 DIAGNOSIS — M25642 Stiffness of left hand, not elsewhere classified: Secondary | ICD-10-CM

## 2019-09-07 DIAGNOSIS — M79642 Pain in left hand: Secondary | ICD-10-CM

## 2019-09-07 NOTE — Therapy (Signed)
[facility] [address] [place], [address] Phone: [phone]   Fax:  [phone]  Occupational Therapy Treatment  Patient Details  Name: Tonya Brewer MRN: [id number] Date of Birth: 09/08/1956 Referring Provider (OT): Rudene Christians   Encounter Date: 09/07/2019  OT End of Session - 09/07/19 0945    Visit Number  9    Number of Visits  12    Date for OT Re-Evaluation  09/11/19    OT Start Time  0900    OT Stop Time  0942    OT Time Calculation (min)  42 min    Activity Tolerance  Patient tolerated treatment well    Behavior During Therapy  [facility] for tasks assessed/performed       Past Medical History:  Diagnosis Date  . Cancer ([place]) 07/2012   GI cancer,  . Personal history of chemotherapy    taking a break from chemo pill to see Dr in june 2020    Past Surgical History:  Procedure Laterality Date  . ABDOMINAL HYSTERECTOMY    . INCISION AND DRAINAGE ABSCESS Left 07/13/2019   Procedure: LEFT HAND INCISION AND DRAINAGE ABSCESS;  Surgeon: Hessie Knows, MD;  Location: [facility] ORS;  Service: Orthopedics;  Laterality: Left;    There were no vitals filed for this visit.  Subjective Assessment - 09/07/19 0944    Subjective   Doing very well - work doing good -and making tight fist - finger tips still little numb    Pertinent History  Pt think abcess started after insect bite -  left hand incision and drainage date of surgery 07/13/2019. Iodoform packing was placed but not last appt . Culture shows Staphylococcus aureus. She is been taking Bactrim which shows to be sensitive and done iwth it now per pt  Pain and swelling much improved. Refer to hand therapy /OT    Patient Stated Goals  Want to get my motion and strength back in my L hand , and swelling as well as numbness that I can go back to work    Currently in Pain?  No/denies         [facility] OT Assessment - 09/07/19 0001      Strength   Right Hand  Grip (lbs)  50    Right Hand Lateral Pinch  12 lbs    Right Hand 3 Point Pinch  5 lbs    Left Hand Grip (lbs)  33    Left Hand Lateral Pinch  7 lbs    Left Hand 3 Point Pinch  3 lbs         Semmes-Weinstein -able to feel this date on thumb thru 5th volar digits 3.61  But only on palm and proximal thumb able to feel  2.83 Cont to show progress in sensation       OT Treatments/Exercises (OP) - 09/07/19 0001      LUE Paraffin   Number Minutes Paraffin  8 Minutes    LUE Paraffin Location  Hand    Comments  prior to soft tissue and intinsic fist stretch         patient was seen for manual skills for soft tissue mobs with carpal and metacarpal spreads, web space and lateral bands of PIPs. Scar massage performed manual techniquesand vibration Use of graston tool nr2 on the volar side of the wrist, forearmsweeping   focus on intrinsic fist stretch  AROM intrinsic fist, AAROM andAnd Place and hold Composite  fistto palm   Cont with greenputty gripping , lat and 3 point grip - butshould haveno pain on volar wrist- If no pain can do 2-3 sets  with rolling putty alternating Cont withRubber band for PA and RA of thumb -3 x 12 reps each      OT Education - 09/07/19 0945    Education Details  progress and HEP    Person(s) Educated  Patient    Methods  Explanation;Demonstration;Tactile cues;Verbal cues;Handout    Comprehension  Verbalized understanding;Returned demonstration       OT Short Term Goals - 09/07/19 0951      OT SHORT TERM GOAL #1   Title  Pt to be independent in HEP to increase AROM in digits flexion , extention ,  thumb flexion  to use in more than 50 % of functional  tasks    Baseline  no knowledge in HEP , and functional use on PRWHE 47/50 - only 6% use- NOW - no issues - only [place] but that was issue prior because of thumb and RD of digits    Status  Achieved      OT SHORT TERM GOAL #2   Title  Numbness in L hand improve to less than  50% of the time - day and night time    Baseline  numbness since surgery constant in radial 3 digits  NOW progressing from 4.56 to now only light touch impaired in volar digits wiht semmes Karren Cobble    Time  2    Period  Weeks    Status  On-going    Target Date  09/21/19        OT Long Term Goals - 09/07/19 0953      OT LONG TERM GOAL #1   Title  L digits flexion improve to pt to touch palm to use hand in more than 75%  functional tasks    Baseline  use in all activities and touching palm - working still on intrinsic fist    Status  Achieved      OT LONG TERM GOAL #2   Title  L grip strength improve to more than 50 % compare to R hand    Baseline  Grip L 33 lbs and R 50    Status  Achieved      OT LONG TERM GOAL #3   Title  Function score on PRWHE improve with more than 30 points    Baseline  Function score on PREE ateval 47/50 , pain 7/50 - and now WNL    Status  Achieved            Plan - 09/07/19 0946    Clinical Impression Statement  Pt about 8 wks s/p L thenar eminence incision and drainage  - decrease scar tissue and  edema - show again increase sensation to Semmes weinstein now 3.61 on all digits to DIP - increase grip and lat grip - pt to cont with HEP and will follow up in 2 wks this time    OT Occupational Profile and History  Problem Focused Assessment - Including review of records relating to presenting problem    Occupational performance deficits (Please refer to evaluation for details):  ADL's;IADL's;Work;Play;Leisure;Social Participation    Body Structure / Function / Physical Skills  ADL;Decreased knowledge of precautions;Flexibility;ROM;UE functional use;Scar mobility;FMC;Dexterity;Edema;Pain;Strength;IADL    Rehab Potential  Good    Clinical Decision Making  Limited treatment options, no task modification necessary    Comorbidities Affecting Occupational  Performance:  None    Modification or Assistance to Complete Evaluation   No modification of tasks or  assist necessary to complete eval    OT Frequency  Biweekly    OT Duration  2 weeks    OT Treatment/Interventions  Self-care/ADL training;Therapeutic exercise;Patient/family education;Paraffin;Fluidtherapy;Contrast Bath;Manual Therapy;Passive range of motion;Scar mobilization    Plan  progress with  HEP assess and sensation assess    OT Home Exercise Plan  see pt instruction    Consulted and Agree with Plan of Care  Patient       Patient will benefit from skilled therapeutic intervention in order to improve the following deficits and impairments:   Body Structure / Function / Physical Skills: ADL, Decreased knowledge of precautions, Flexibility, ROM, UE functional use, Scar mobility, FMC, Dexterity, Edema, Pain, Strength, IADL       Visit Diagnosis: Pain in left hand  Stiffness of left hand, not elsewhere classified  Localized edema  Muscle weakness (generalized)    Problem List There are no active problems to display for this patient.   Rosalyn Gess OTR/L,CLT 09/07/2019, 9:55 AM  [facility] [address], [place], [address] Phone: [phone]   Fax:  [phone]  Name: Moniqua Weltzin MRN: [id number] Date of Birth: 19-Jun-1956

## 2019-09-07 NOTE — Patient Instructions (Signed)
Same

## 2019-09-21 ENCOUNTER — Ambulatory Visit: Payer: Medicare Other | Attending: Orthopedic Surgery | Admitting: Occupational Therapy

## 2019-09-21 ENCOUNTER — Other Ambulatory Visit: Payer: Self-pay

## 2019-09-21 DIAGNOSIS — R600 Localized edema: Secondary | ICD-10-CM | POA: Insufficient documentation

## 2019-09-21 DIAGNOSIS — M79642 Pain in left hand: Secondary | ICD-10-CM | POA: Diagnosis not present

## 2019-09-21 DIAGNOSIS — M6281 Muscle weakness (generalized): Secondary | ICD-10-CM | POA: Diagnosis present

## 2019-09-21 DIAGNOSIS — M25642 Stiffness of left hand, not elsewhere classified: Secondary | ICD-10-CM | POA: Diagnosis present

## 2019-09-21 NOTE — Therapy (Signed)
[facility] [address] [place], [address] Phone: [phone]   Fax:  [phone]  Occupational Therapy Treatment  Patient Details  Name: Sidonia Knake MRN: [id number] Date of Birth: 1956/10/30 Referring Provider (OT): Rudene Christians   Encounter Date: 09/21/2019  OT End of Session - 09/21/19 0956    Visit Number  10    Number of Visits  12    Date for OT Re-Evaluation  10/12/19    OT Start Time  0851    OT Stop Time  1000    OT Time Calculation (min)  69 min    Activity Tolerance  Patient tolerated treatment well    Behavior During Therapy  [facility] for tasks assessed/performed       Past Medical History:  Diagnosis Date  . Cancer ([place]) 07/2012   GI cancer,  . Personal history of chemotherapy    taking a break from chemo pill to see Dr in june 2020    Past Surgical History:  Procedure Laterality Date  . ABDOMINAL HYSTERECTOMY    . INCISION AND DRAINAGE ABSCESS Left 07/13/2019   Procedure: LEFT HAND INCISION AND DRAINAGE ABSCESS;  Surgeon: Hessie Knows, MD;  Location: [facility] ORS;  Service: Orthopedics;  Laterality: Left;    There were no vitals filed for this visit.  Subjective Assessment - 09/21/19 0953    Subjective   Using my hand in every thing - numbness feels better- my middle finger to trigger some times    Pertinent History  Pt think abcess started after insect bite -  left hand incision and drainage date of surgery 07/13/2019. Iodoform packing was placed but not last appt . Culture shows Staphylococcus aureus. She is been taking Bactrim which shows to be sensitive and done iwth it now per pt  Pain and swelling much improved. Refer to hand therapy /OT    Patient Stated Goals  Want to get my motion and strength back in my L hand , and swelling as well as numbness that I can go back to work    Currently in Pain?  No/denies         [facility] OT Assessment - 09/21/19 0001      Strength   Right  Hand Grip (lbs)  50    Right Hand Lateral Pinch  12 lbs    Right Hand 3 Point Pinch  5 lbs    Left Hand Grip (lbs)  35    Left Hand Lateral Pinch  7 lbs    Left Hand 3 Point Pinch  3 lbs      Left Hand AROM   L Index  MCP 0-90  85 Degrees    L Index PIP 0-100  100 Degrees    L Long  MCP 0-90  85 Degrees    L Long PIP 0-100  100 Degrees    L Ring  MCP 0-90  85 Degrees    L Ring PIP 0-100  100 Degrees    L Little  MCP 0-90  90 Degrees    L Little PIP 0-100  95 Degrees        assess AROM and grip /prehension strength  Using hand functional in all activities -  Semmes weinstein - pt testing normal sensation all digits to tips  Pt tender over A1 pulley of 3rd digit - report some triggering  Pt to do some ice massage   And Pt to stop putty  Enlarge handles -  avoid tight grip Heat to decrease stiffness  tendon glides  Massage to volar wrist  Ice massage over 3rd digit palm - tender - several time during day  Sleep open han       OT Treatments/Exercises (OP) - 09/21/19 0001      LUE Contrast Bath   Time  9 minutes    Comments  prior to soft tissue to volar wrist       soft tissue massage to volar wrist and 3rd digit - graston tool nr 2 for brushing and sweeping  Done ionto with dexamethazone to 3rd A1pulley at 2.0 current - small patch 20 min - kicked off 2 times and restarted   pt to keep on for hour afterwards  Pt to call if cont with trigger finger symptoms otherwise will discharge her in 3 wks        OT Education - 09/21/19 0955    Education Details  progress and changes to HEP    Person(s) Educated  Patient    Methods  Explanation;Demonstration;Tactile cues;Verbal cues;Handout    Comprehension  Verbalized understanding;Returned demonstration       OT Short Term Goals - 09/21/19 1000      OT SHORT TERM GOAL #1   Title  Pt to be independent in HEP to increase AROM in digits flexion , extention ,  thumb flexion  to use in more than 50 % of functional   tasks    Baseline  normal use of lLhand - AROM WNL    Status  Achieved      OT SHORT TERM GOAL #2   Title  Numbness in L hand improve to less than 50% of the time - day and night time    Baseline  normal sension with semmes weinstein this date    Status  Achieved        OT Long Term Goals - 09/21/19 1001      OT LONG TERM GOAL #1   Title  L digits flexion improve to pt to touch palm to use hand in more than 75%  functional tasks    Status  Achieved      OT LONG TERM GOAL #2   Title  L grip strength improve to more than 50 % compare to R hand    Baseline  Grip L 35, R 50    Status  Achieved      OT LONG TERM GOAL #3   Title  Function score on PRWHE improve with more than 30 points    Baseline  Function score on PREE ateval 47/50 , pain 7/50 - and now WNL    Status  Achieved            Plan - 09/21/19 0956    Clinical Impression Statement  Pt is 10 wks s/p L thenar eminence incision and drainage - great  progress in grip strength and AROM - sensation on Semmes weinstein testing normal on all digits this date - pt do report trigger finger on 3rd this date - ed pt on what to do the next 3 wks at thome and follow up if needed    OT Occupational Profile and History  Problem Focused Assessment - Including review of records relating to presenting problem    Occupational performance deficits (Please refer to evaluation for details):  ADL's;IADL's;Work;Play;Leisure;Social Participation    Body Structure / Function / Physical Skills  ADL;Decreased knowledge of precautions;Flexibility;ROM;UE functional use;Scar mobility;FMC;Dexterity;Edema;Pain;Strength;IADL    Rehab Potential  Good    Clinical Decision Making  Limited treatment options, no task modification necessary    Comorbidities Affecting Occupational Performance:  None    Modification or Assistance to Complete Evaluation   No modification of tasks or assist necessary to complete eval    OT Frequency  --   3wks   OT Duration   --   3 wks   OT Treatment/Interventions  Self-care/ADL training;Therapeutic exercise;Patient/family education;Paraffin;Fluidtherapy;Contrast Bath;Manual Therapy;Passive range of motion;Scar mobilization;Iontophoresis    Plan  pt to follow up if needed in 3 wks - otherwise will discharge    OT Home Exercise Plan  see pt instruction    Consulted and Agree with Plan of Care  Patient       Patient will benefit from skilled therapeutic intervention in order to improve the following deficits and impairments:   Body Structure / Function / Physical Skills: ADL, Decreased knowledge of precautions, Flexibility, ROM, UE functional use, Scar mobility, FMC, Dexterity, Edema, Pain, Strength, IADL       Visit Diagnosis: Pain in left hand - Plan: Ot plan of care cert/re-cert  Stiffness of left hand, not elsewhere classified - Plan: Ot plan of care cert/re-cert  Localized edema - Plan: Ot plan of care cert/re-cert  Muscle weakness (generalized) - Plan: Ot plan of care cert/re-cert    Problem List There are no active problems to display for this patient.   Rosalyn Gess OTR/L,CLT 09/21/2019, 1:15 PM  [facility] [address] [place], [address] Phone: [phone]   Fax:  [phone]  Name: Kusum Gunion MRN: [id number] Date of Birth: 12-Sep-1956

## 2019-09-21 NOTE — Patient Instructions (Signed)
Pt to stop putty  Enlarge handles - avoid tight grip Heat to decrease stiffness  tendon glides  Massage to volar wrist  Ice massage over 3rd digit palm - tender - several time during day  Sleep open hand

## 2019-10-12 ENCOUNTER — Ambulatory Visit: Payer: Medicare Other | Attending: Orthopedic Surgery | Admitting: Occupational Therapy

## 2019-10-12 ENCOUNTER — Other Ambulatory Visit: Payer: Self-pay

## 2019-10-12 DIAGNOSIS — R600 Localized edema: Secondary | ICD-10-CM | POA: Diagnosis present

## 2019-10-12 DIAGNOSIS — M25642 Stiffness of left hand, not elsewhere classified: Secondary | ICD-10-CM | POA: Insufficient documentation

## 2019-10-12 DIAGNOSIS — M6281 Muscle weakness (generalized): Secondary | ICD-10-CM | POA: Insufficient documentation

## 2019-10-12 DIAGNOSIS — M79642 Pain in left hand: Secondary | ICD-10-CM | POA: Insufficient documentation

## 2019-10-12 NOTE — Patient Instructions (Signed)
Functional use

## 2019-10-12 NOTE — Therapy (Signed)
[facility] [address] [place], [address] Phone: [phone]   Fax:  [phone]  Occupational Therapy Treatment  Patient Details  Name: Tonya Brewer MRN: [id number] Date of Birth: 1956-01-22 Referring Provider (OT): Rudene Christians   Encounter Date: 10/12/2019  OT End of Session - 10/12/19 1108    Visit Number  11    Number of Visits  11    Date for OT Re-Evaluation  10/12/19    OT Start Time  0900    OT Stop Time  0938    OT Time Calculation (min)  38 min    Activity Tolerance  Patient tolerated treatment well    Behavior During Therapy  [facility] for tasks assessed/performed       Past Medical History:  Diagnosis Date  . Cancer ([place]) 07/2012   GI cancer,  . Personal history of chemotherapy    taking a break from chemo pill to see Dr in june 2020    Past Surgical History:  Procedure Laterality Date  . ABDOMINAL HYSTERECTOMY    . INCISION AND DRAINAGE ABSCESS Left 07/13/2019   Procedure: LEFT HAND INCISION AND DRAINAGE ABSCESS;  Surgeon: Hessie Knows, MD;  Location: [facility] ORS;  Service: Orthopedics;  Laterality: Left;    There were no vitals filed for this visit.  Subjective Assessment - 10/12/19 1103    Subjective   I am doing very well - using my hand in everything , numbness much better and gripping too - but my middle finger still locks - mostly in the am stiff and locks and some during day    Pertinent History  Pt think abcess started after insect bite -  left hand incision and drainage date of surgery 07/13/2019. Iodoform packing was placed but not last appt . Culture shows Staphylococcus aureus. She is been taking Bactrim which shows to be sensitive and done iwth it now per pt  Pain and swelling much improved. Refer to hand therapy /OT    Patient Stated Goals  Want to get my motion and strength back in my L hand , and swelling as well as numbness that I can go back to work    Currently in Pain?   No/denies         [facility] OT Assessment - 10/12/19 0001      Strength   Right Hand Grip (lbs)  50    Right Hand Lateral Pinch  12 lbs    Right Hand 3 Point Pinch  3 lbs    Left Hand Grip (lbs)  36    Left Hand Lateral Pinch  7 lbs    Left Hand 3 Point Pinch  5 lbs      Left Hand AROM   L Index  MCP 0-90  85 Degrees    L Index PIP 0-100  100 Degrees    L Long  MCP 0-90  85 Degrees    L Long PIP 0-100  100 Degrees    L Ring  MCP 0-90  80 Degrees    L Ring PIP 0-100  100 Degrees    L Little  MCP 0-90  90 Degrees    L Little PIP 0-100  100 Degrees       Pt maintain her AROM and grip strength with HEP  since seen about 3-4 wks ago  Made great progress from [facility] in AROM , grip strength and numbness  No pain - but do have trigger  finger on L 3rd - stiffness at night time and trigger some during day  Tenderness over A1 pulley - 8/10 per pt  Recommend pt to call surgeon office to be seen for possible shot   Measurements taken See flow sheet  pt do have RD of digits on L hand - and unable to do 3 point grip - slips into lateral grip  Did fit her with neoprene anti ulnar devation splint to wear 30 min to 2 hrs at time few times during day   but gradually increase wearing time - and look out for redness in webspace's   Graston tool nr 2 done for sweeping , brushing over volar hand and wrist   Pt discharge at this time from OT                 OT Education - 10/12/19 1108    Education Details  progress and discharge instructions    Person(s) Educated  Patient    Methods  Explanation;Demonstration;Tactile cues;Verbal cues;Handout    Comprehension  Verbalized understanding;Returned demonstration       OT Short Term Goals - 09/21/19 1000      OT SHORT TERM GOAL #1   Title  Pt to be independent in HEP to increase AROM in digits flexion , extention ,  thumb flexion  to use in more than 50 % of functional  tasks    Baseline  normal use of lLhand - AROM WNL    Status   Achieved      OT SHORT TERM GOAL #2   Title  Numbness in L hand improve to less than 50% of the time - day and night time    Baseline  normal sension with semmes weinstein this date    Status  Achieved        OT Long Term Goals - 09/21/19 1001      OT LONG TERM GOAL #1   Title  L digits flexion improve to pt to touch palm to use hand in more than 75%  functional tasks    Status  Achieved      OT LONG TERM GOAL #2   Title  L grip strength improve to more than 50 % compare to R hand    Baseline  Grip L 35, R 50    Status  Achieved      OT LONG TERM GOAL #3   Title  Function score on PRWHE improve with more than 30 points    Baseline  Function score on PREE ateval 47/50 , pain 7/50 - and now WNL    Status  Achieved            Plan - 10/12/19 1109    Clinical Impression Statement  Pt is 13 wks s/p L thenar eminence incision and drainage - she made great progress in AROM in L hand  and grip strength - as well as sensation testing normal on Semmes Weinstein on all digit- little delay still on 2nd and 3rd DIP - pt do show since 3-4 wks ago  with last visit a trigger finger on 3rd - pt cont to have issues - recommend for pt to call sugeon to be evaulated for possible shot - discharge at this time for L hand    OT Occupational Profile and History  Problem Focused Assessment - Including review of records relating to presenting problem    Occupational performance deficits (Please refer to evaluation for details):  ADL's;IADL's;Work;Play;Leisure;Social Participation  Body Structure / Function / Physical Skills  ADL;Decreased knowledge of precautions;Flexibility;ROM;UE functional use;Scar mobility;FMC;Dexterity;Edema;Pain;Strength;IADL    Rehab Potential  Good    Clinical Decision Making  Limited treatment options, no task modification necessary    Comorbidities Affecting Occupational Performance:  None    Modification or Assistance to Complete Evaluation   No modification of tasks or  assist necessary to complete eval    Plan  discharge and refer to surgeon for trigger finger    OT Home Exercise Plan  see pt instruction    Consulted and Agree with Plan of Care  Patient       Patient will benefit from skilled therapeutic intervention in order to improve the following deficits and impairments:   Body Structure / Function / Physical Skills: ADL, Decreased knowledge of precautions, Flexibility, ROM, UE functional use, Scar mobility, FMC, Dexterity, Edema, Pain, Strength, IADL       Visit Diagnosis: Pain in left hand  Stiffness of left hand, not elsewhere classified  Localized edema  Muscle weakness (generalized)    Problem List There are no active problems to display for this patient.   Rosalyn Gess OTR/L,CLT 10/12/2019, 11:15 AM  [facility] [address] [place], [address] Phone: [phone]   Fax:  [phone]  Name: Tonya Brewer MRN: [id number] Date of Birth: 02-24-1956

## 2020-01-01 ENCOUNTER — Other Ambulatory Visit: Payer: Self-pay | Admitting: Internal Medicine

## 2020-01-01 DIAGNOSIS — Z1231 Encounter for screening mammogram for malignant neoplasm of breast: Secondary | ICD-10-CM

## 2020-01-23 ENCOUNTER — Ambulatory Visit
Admission: RE | Admit: 2020-01-23 | Discharge: 2020-01-23 | Disposition: A | Discharge status: Home / Self Care | Payer: Medicare Other | Source: Ambulatory Visit | Attending: Internal Medicine | Admitting: Internal Medicine

## 2020-01-23 ENCOUNTER — Other Ambulatory Visit: Payer: Self-pay

## 2020-01-23 DIAGNOSIS — Z1231 Encounter for screening mammogram for malignant neoplasm of breast: Secondary | ICD-10-CM | POA: Diagnosis not present

## 2020-12-30 ENCOUNTER — Other Ambulatory Visit: Payer: Self-pay | Admitting: Internal Medicine

## 2020-12-30 DIAGNOSIS — Z1231 Encounter for screening mammogram for malignant neoplasm of breast: Secondary | ICD-10-CM

## 2021-01-23 ENCOUNTER — Other Ambulatory Visit: Payer: Self-pay

## 2021-01-23 ENCOUNTER — Ambulatory Visit
Admission: RE | Admit: 2021-01-23 | Discharge: 2021-01-23 | Disposition: A | Discharge status: Home / Self Care | Payer: Medicare Other | Source: Ambulatory Visit | Attending: Internal Medicine | Admitting: Internal Medicine

## 2021-01-23 DIAGNOSIS — Z1231 Encounter for screening mammogram for malignant neoplasm of breast: Secondary | ICD-10-CM | POA: Insufficient documentation

## 2021-05-05 ENCOUNTER — Other Ambulatory Visit: Payer: Self-pay

## 2021-05-05 ENCOUNTER — Emergency Department
Admission: EM | Admit: 2021-05-05 | Discharge: 2021-05-05 | Disposition: A | Discharge status: Home / Self Care | Payer: Medicare Other | Attending: Emergency Medicine | Admitting: Emergency Medicine

## 2021-05-05 ENCOUNTER — Emergency Department: Payer: Medicare Other

## 2021-05-05 DIAGNOSIS — Z85028 Personal history of other malignant neoplasm of stomach: Secondary | ICD-10-CM | POA: Insufficient documentation

## 2021-05-05 DIAGNOSIS — Z23 Encounter for immunization: Secondary | ICD-10-CM | POA: Insufficient documentation

## 2021-05-05 DIAGNOSIS — S0181XA Laceration without foreign body of other part of head, initial encounter: Secondary | ICD-10-CM | POA: Diagnosis not present

## 2021-05-05 DIAGNOSIS — S0990XA Unspecified injury of head, initial encounter: Secondary | ICD-10-CM | POA: Diagnosis present

## 2021-05-05 DIAGNOSIS — Y990 Civilian activity done for income or pay: Secondary | ICD-10-CM | POA: Diagnosis not present

## 2021-05-05 DIAGNOSIS — W01198A Fall on same level from slipping, tripping and stumbling with subsequent striking against other object, initial encounter: Secondary | ICD-10-CM | POA: Diagnosis not present

## 2021-05-05 MED ORDER — TETANUS-DIPHTH-ACELL PERTUSSIS 5-2.5-18.5 LF-MCG/0.5 IM SUSY
0.5000 mL | PREFILLED_SYRINGE | Freq: Once | INTRAMUSCULAR | Status: AC
  Administered 2021-05-05: 0.5 mL via INTRAMUSCULAR
  Filled 2021-05-05: qty 0.5

## 2021-05-05 MED ORDER — LIDOCAINE-EPINEPHRINE 2 %-1:100000 IJ SOLN
20.0000 mL | Freq: Once | INTRAMUSCULAR | Status: AC
  Administered 2021-05-05: 20 mL via INTRADERMAL
  Filled 2021-05-05: qty 1

## 2021-05-05 MED ORDER — CEPHALEXIN 500 MG PO CAPS: 500.0000 mg | ORAL_CAPSULE | Freq: Two times a day (BID) | ORAL | 0 refills | Status: AC

## 2021-05-05 NOTE — ED Notes (Signed)
D/C and new ABX RX discussed with pt, pt verbalized understanding. NAD noted. Pt educated on wound care and suture removal. Pt ambulatory with steady gait.

## 2021-05-05 NOTE — ED Notes (Signed)
Pt at CT

## 2021-05-05 NOTE — ED Provider Notes (Signed)
LACERATION REPAIR Performed by: Marlana Salvage Authorized by: Marlana Salvage Consent: Verbal consent obtained. Risks and benefits: risks, benefits and alternatives were discussed Consent given by: patient Patient identity confirmed: provided demographic data Prepped and Draped in normal sterile fashion Wound explored  Laceration Location: central forehead  Laceration Length: 3cm  No Foreign Bodies seen or palpated  Anesthesia: local infiltration  Local anesthetic: lidocaine 1% with epinephrine  Anesthetic total: 2 ml  Irrigation method: syringe Amount of cleaning: standard  Skin closure: 6-0 nylon  Number of sutures: 5  Technique: simple interrupted  Patient tolerance: Patient tolerated the procedure well with no immediate complications.    Marlana Salvage, PA 05/05/21 1521    Vanessa Wirt, MD 05/05/21 [phone]

## 2021-05-05 NOTE — Discharge Instructions (Addendum)
IMPRESSION: 1. No evidence of acute intracranial abnormality. 2. Linear cutaneous skin defect overlying the midline frontal bone extending to the proximal nares. 3. Pneumatized secretions in the right anterior ethmoid air cells and frontal sinus. Correlate for signs and symptoms of sinusitis. 4. Linear well corticate lucency in the supraorbital right frontal bone, favored to represent a vascular channel however given the underlying fluid in the frontal and ethmoid sinus, recommend correlation with tenderness to palpation.

## 2021-05-05 NOTE — ED Triage Notes (Signed)
Pt states she was working outside and tripped over a bag and hit her fore head on a rock, pt has abrasion to the upper lip and nose, a lac between eyes with controlled bleeding., pt denies LOC, is Not on any blood thinners. Pt is ambulatory with a steady gait, pt is a/ox4 at present. Denies any neck pain or other injury, just her head is hurting.

## 2021-05-05 NOTE — ED Notes (Signed)
Pt presents to ED with c/o of having a fall yesterday where she hit her head on a rock in her garden. Pt states she was sent here by UC. Pt denies taking blood thinners. Pt denies LOC. Pt does have a small laceration to in between her eyes and also and abrasion to her nose. Pt denies issues with vision or dizziness.   Pt denies neck or back pain. Pt denies new numbness or tingling. Pt is A&Ox4.

## 2021-05-05 NOTE — ED Provider Notes (Signed)
[facility] Emergency Department Provider Note  ____________________________________________   Event Date/Time   First MD Initiated Contact with Patient 05/05/21 1320     (approximate)  I have reviewed the triage vital signs and the nursing notes.   HISTORY  Chief Complaint Fall and Head Injury    HPI Tonya Brewer is a 65 y.o. female with GI cancer who comes in for a fall.  Patient states that she was working outside when she tripped over a bag and hit her forehead on a rock.  Patient has an abrasion to the upper lip and laceration between the eyes.  She denies any loss of consciousness is not on any blood thinners.  Denies any neck pain, numbness or tingling in her hands.  She states that she just fell forward.  Patient reports some pain, constant, nothing makes it better or worse denies any other injuries          Past Medical History:  Diagnosis Date   Cancer ([place]) 07/2012   GI cancer,   Personal history of chemotherapy    taking a break from chemo pill to see Dr in june 2020, not taking now    There are no problems to display for this patient.   Past Surgical History:  Procedure Laterality Date   ABDOMINAL HYSTERECTOMY     INCISION AND DRAINAGE ABSCESS Left 07/13/2019   Procedure: LEFT HAND INCISION AND DRAINAGE ABSCESS;  Surgeon: Hessie Knows, MD;  Location: [facility] ORS;  Service: Orthopedics;  Laterality: Left;    Prior to Admission medications   Medication Sig Start Date End Date Taking? Authorizing Provider  HYDROcodone-acetaminophen (NORCO/VICODIN) 5-325 MG tablet Take 1-2 tablets by mouth every 4 (four) hours as needed for moderate pain. Patient not taking: Reported on 07/13/2019 11/01/17   Hinda Kehr, MD  imatinib (GLEEVEC) 400 MG tablet Take 400 mg by mouth daily.    [provider]  loratadine (CLARITIN) 10 MG tablet Take 10 mg by mouth daily as needed for allergies.    [provider]  lovastatin  (MEVACOR) 40 MG tablet Take 40 mg by mouth every evening.    [provider]  potassium chloride SA (K-DUR,KLOR-CON) 20 MEQ tablet Take 1 tablet by mouth daily.    [provider]  sulfamethoxazole-trimethoprim (BACTRIM DS) 800-160 MG tablet Take 1 tablet by mouth 2 (two) times daily.    [provider]  traMADol (ULTRAM) 50 MG tablet Take 50 mg by mouth every 6 (six) hours as needed.    [provider]  triamterene-hydrochlorothiazide (MAXZIDE-25) 37.5-25 MG tablet Take 1 tablet by mouth daily.    [provider]    Allergies Patient has no known allergies.  Family History  Problem Relation Age of Onset   Breast cancer Neg Hx     Social History Social History   Tobacco Use   Smoking status: Never   Smokeless tobacco: Never  Vaping Use   Vaping Use: Never used  Substance Use Topics   Alcohol use: Never   Drug use: Never      Review of Systems Constitutional: No fever/chills Eyes: No visual changes. ENT: Head laceration, fall Cardiovascular: Denies chest pain. Respiratory: Denies shortness of breath. Gastrointestinal: No abdominal pain.  No nausea, no vomiting.  No diarrhea.  No constipation. Genitourinary: Negative for dysuria. Musculoskeletal: Negative for back pain. Skin: Negative for rash. Neurological: Negative for headaches, focal weakness or numbness. All other ROS negative ____________________________________________   PHYSICAL EXAM:  VITAL  SIGNS: ED Triage Vitals  Enc Vitals Group     BP 05/05/21 1259 (!) 161/109     Pulse Rate 05/05/21 1301 (!) 101     Resp 05/05/21 1259 18     Temp 05/05/21 1259 98.4 F (36.9 C)     Temp Source 05/05/21 1259 Oral     SpO2 05/05/21 1259 96 %     Weight 05/05/21 1300 235 lb (106.6 kg)     Height 05/05/21 1300 5\' 1"  (1.549 m)     Head Circumference --      Peak Flow --      Pain Score 05/05/21 1259 7     Pain Loc --      Pain Edu? --      Excl. in [place]? --      Constitutional: Alert and oriented. Well appearing and in no acute distress. Eyes: Conjunctivae are normal. EOMI. Head: 5 cm linear laceration vertically in between her eyes. Nose: No congestion/rhinnorhea. No septal hematoma  Mouth/Throat: Mucous membranes are moist.  Some abrasions noted to the upper lip Neck: No stridor. Trachea Midline. FROM Cardiovascular: Normal rate, regular rhythm. Grossly normal heart sounds.  Good peripheral circulation.  No chest wall tenderness Respiratory: Normal respiratory effort.  No retractions. Lungs CTAB. Gastrointestinal: Soft and nontender. No distention. No abdominal bruits.  Musculoskeletal: No lower extremity tenderness nor edema.  No joint effusions.  No other extremity tenderness Neurologic:  Normal speech and language. No gross focal neurologic deficits are appreciated.  Skin:  Skin is warm, dry and intact. No rash noted. Psychiatric: Mood and affect are normal. Speech and behavior are normal. GU: Deferred  Back: No CTL spine ____________________________________________   RADIOLOGY   Official radiology report(s): CT Head Wo Contrast  Result Date: 05/05/2021 CLINICAL DATA:  Fall yesterday with head trauma. EXAM: CT HEAD WITHOUT CONTRAST CT MAXILLOFACIAL WITHOUT CONTRAST TECHNIQUE: Multidetector CT imaging of the head and maxillofacial structures were performed using the standard protocol without intravenous contrast. Multiplanar CT image reconstructions of the maxillofacial structures were also generated. COMPARISON:  None. FINDINGS: CT HEAD FINDINGS Brain: No evidence of acute large vascular territory infarction, hemorrhage, hydrocephalus, extra-axial collection or mass lesion/mass effect. Partially empty sella turcica. Vascular: No hyperdense vessel. Atherosclerotic calcifications of the internal carotid and vertebral arteries at the skull base. Skull: No aggressive lytic or blastic lesion of bone. Other: Mastoid air cells are predominantly  clear. CT MAXILLOFACIAL FINDINGS Osseous: Mild rightward angulation of the nasal bones. Linear well corticate lucency in the supraorbital right frontal bone on image 24/3 and 30/9, favored represent a vascular channel. Dental hardware. No destructive process. Orbits: Negative. No traumatic or inflammatory finding. Sinuses: Pneumatized secretions in the right anterior ethmoid air cells and frontal sinus. Soft tissues: Cutaneous skin defect overlying the midline frontal bone extending to the proximal nares. IMPRESSION: 1. No evidence of acute intracranial abnormality. 2. Linear cutaneous skin defect overlying the midline frontal bone extending to the proximal nares. 3. Pneumatized secretions in the right anterior ethmoid air cells and frontal sinus. Correlate for signs and symptoms of sinusitis. 4. Linear well corticate lucency in the supraorbital right frontal bone, favored to represent a vascular channel however given the underlying fluid in the frontal and ethmoid sinus, recommend correlation with tenderness to palpation. Electronically Signed   By: Dahlia Bailiff MD   On: 05/05/2021 14:15   CT Maxillofacial Wo Contrast  Result Date: 05/05/2021 CLINICAL DATA:  Fall yesterday with head trauma. EXAM: CT HEAD WITHOUT CONTRAST CT  MAXILLOFACIAL WITHOUT CONTRAST TECHNIQUE: Multidetector CT imaging of the head and maxillofacial structures were performed using the standard protocol without intravenous contrast. Multiplanar CT image reconstructions of the maxillofacial structures were also generated. COMPARISON:  None. FINDINGS: CT HEAD FINDINGS Brain: No evidence of acute large vascular territory infarction, hemorrhage, hydrocephalus, extra-axial collection or mass lesion/mass effect. Partially empty sella turcica. Vascular: No hyperdense vessel. Atherosclerotic calcifications of the internal carotid and vertebral arteries at the skull base. Skull: No aggressive lytic or blastic lesion of bone. Other: Mastoid air cells  are predominantly clear. CT MAXILLOFACIAL FINDINGS Osseous: Mild rightward angulation of the nasal bones. Linear well corticate lucency in the supraorbital right frontal bone on image 24/3 and 30/9, favored represent a vascular channel. Dental hardware. No destructive process. Orbits: Negative. No traumatic or inflammatory finding. Sinuses: Pneumatized secretions in the right anterior ethmoid air cells and frontal sinus. Soft tissues: Cutaneous skin defect overlying the midline frontal bone extending to the proximal nares. IMPRESSION: 1. No evidence of acute intracranial abnormality. 2. Linear cutaneous skin defect overlying the midline frontal bone extending to the proximal nares. 3. Pneumatized secretions in the right anterior ethmoid air cells and frontal sinus. Correlate for signs and symptoms of sinusitis. 4. Linear well corticate lucency in the supraorbital right frontal bone, favored to represent a vascular channel however given the underlying fluid in the frontal and ethmoid sinus, recommend correlation with tenderness to palpation. Electronically Signed   By: Dahlia Bailiff MD   On: 05/05/2021 14:15    ____________________________________________   PROCEDURES  Procedure(s) performed (including Critical Care):  Procedures   ____________________________________________   INITIAL IMPRESSION / ASSESSMENT AND PLAN / ED COURSE  Madisen Hudson Lehmkuhl was evaluated in Emergency Department on 05/05/2021 for the symptoms described in the history of present illness. She was evaluated in the context of the global COVID-19 pandemic, which necessitated consideration that the patient might be at risk for infection with the SARS-CoV-2 virus that causes COVID-19. Institutional protocols and algorithms that pertain to the evaluation of patients at risk for COVID-19 are in a state of rapid change based on information released by regulatory bodies including the CDC and federal and state organizations.  These policies and algorithms were followed during the patient's care in the ED.    Patient comes in with mechanical fall with injury to her face.  CT head ordered to evaluate for intercranial hemorrhage, CT face to evaluate for cervical fracture.  She denies any cervical spine tenderness.  She is neuro intact with full range of motion her neck therefore unlikely cervical injury.  She has no other chest wall injury or abdominal pain to suggest other injuries.  She denies any other extremity injuries.  Tetanus was updated  CT scan recommend correlate with pain on the right frontal bone and there is no tenderness there so unlikely to be a fracture.  She does have chronic sinus problems but states that she did not take her medications today.  Lac repaired by PA caitlin  3:37 PM reevaluated patient after lac repair.  Very well approximated without any active bleeding   We will prescribe her a short course of Keflex to help prevent infection.  This time they feel comfortable with discharge home         ____________________________________________   FINAL CLINICAL IMPRESSION(S) / ED DIAGNOSES   Final diagnoses:  Injury of head, initial encounter  Laceration of other part of head without foreign body, initial encounter      MEDICATIONS GIVEN  DURING THIS VISIT:  Medications  lidocaine-EPINEPHrine (XYLOCAINE W/EPI) 2 %-1:100000 (with pres) injection 20 mL (20 mLs Intradermal Given by Other 05/05/21 1503)  Tdap (BOOSTRIX) injection 0.5 mL (0.5 mLs Intramuscular Given 05/05/21 1445)     ED Discharge Orders          Ordered    cephALEXin (KEFLEX) 500 MG capsule  2 times daily        05/05/21 1537             Note:  This document was prepared using Dragon voice recognition software and may include unintentional dictation errors.    Vanessa Knowlton, MD 05/05/21 1537

## 2022-01-05 ENCOUNTER — Other Ambulatory Visit: Payer: Self-pay | Admitting: Internal Medicine

## 2022-01-05 DIAGNOSIS — Z1231 Encounter for screening mammogram for malignant neoplasm of breast: Secondary | ICD-10-CM

## 2022-02-26 ENCOUNTER — Ambulatory Visit
Admission: RE | Admit: 2022-02-26 | Discharge: 2022-02-26 | Disposition: A | Discharge status: Home / Self Care | Payer: Medicare Other | Source: Ambulatory Visit | Attending: Internal Medicine | Admitting: Internal Medicine

## 2022-02-26 DIAGNOSIS — Z1231 Encounter for screening mammogram for malignant neoplasm of breast: Secondary | ICD-10-CM

## 2023-01-28 ENCOUNTER — Other Ambulatory Visit: Payer: Self-pay

## 2023-01-28 DIAGNOSIS — Z1231 Encounter for screening mammogram for malignant neoplasm of breast: Secondary | ICD-10-CM

## 2023-03-01 ENCOUNTER — Ambulatory Visit
Admission: RE | Admit: 2023-03-01 | Discharge: 2023-03-01 | Disposition: A | Discharge status: Home / Self Care | Payer: Medicare Other | Source: Ambulatory Visit | Attending: Internal Medicine | Admitting: Internal Medicine

## 2023-03-01 DIAGNOSIS — Z1231 Encounter for screening mammogram for malignant neoplasm of breast: Secondary | ICD-10-CM | POA: Diagnosis not present

## 2023-09-26 IMAGING — MG MM DIGITAL SCREENING BILAT W/ TOMO AND CAD
8 series · 8 of 24 positions shown · non-contrast
Comparison: Previous exam(s).

ACR Breast Density Category a: The breast tissue is almost entirely
fatty.

CLINICAL DATA: Screening.

EXAM:
DIGITAL SCREENING BILATERAL MAMMOGRAM WITH TOMOSYNTHESIS AND CAD
TECHNIQUE: Bilateral screening digital craniocaudal and mediolateral oblique
mammograms were obtained. Bilateral screening digital breast
tomosynthesis was performed. The images were evaluated with
computer-aided detection.

[L MLO synth-2D]
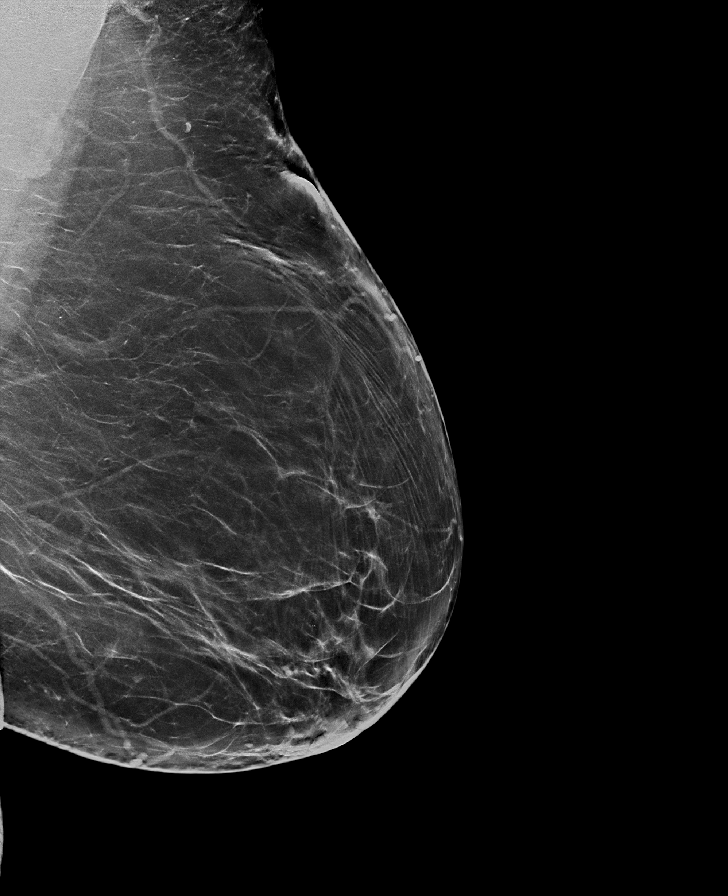

[R MLO synth-2D]
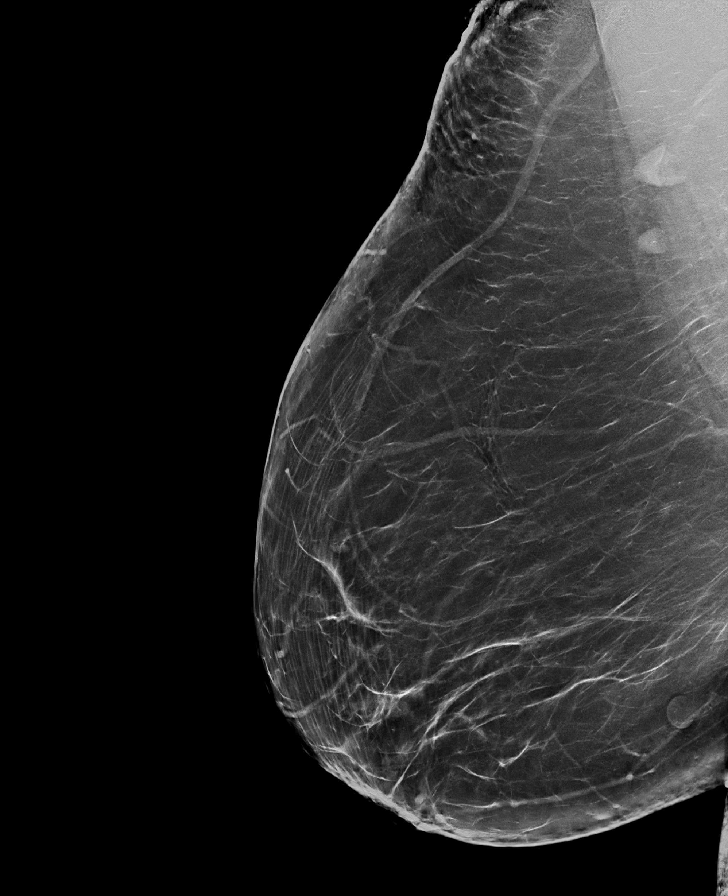

[R CC synth-2D]
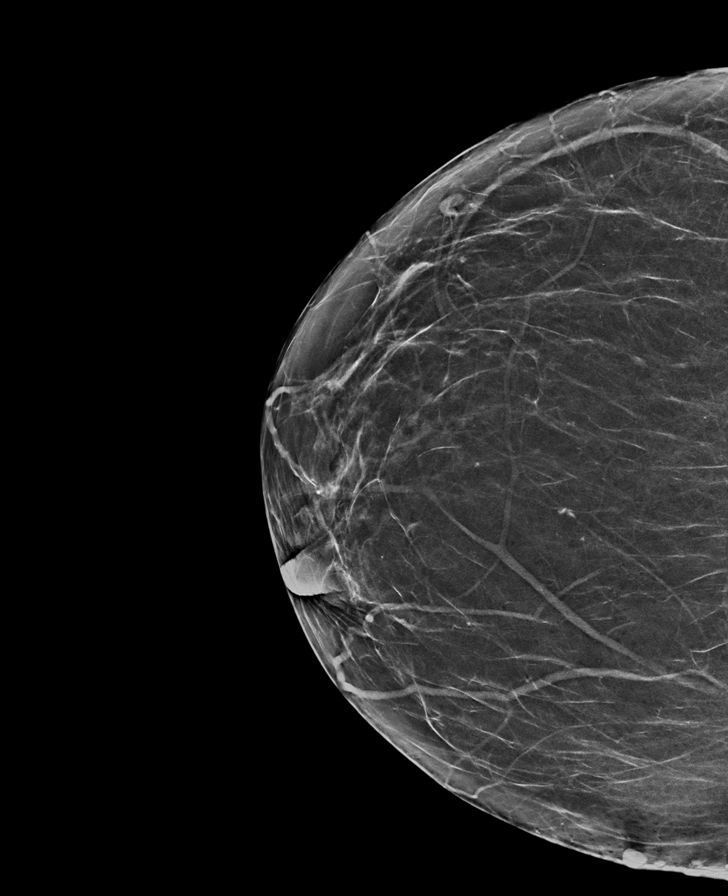

[L CC synth-2D]
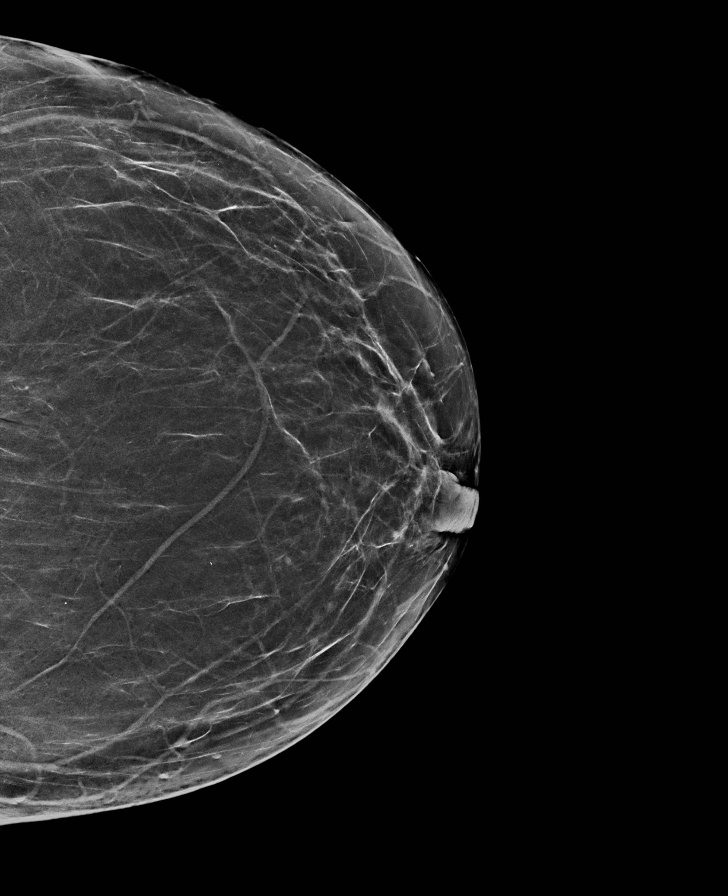

[L CC tomo · tomo slice 33/66.0]
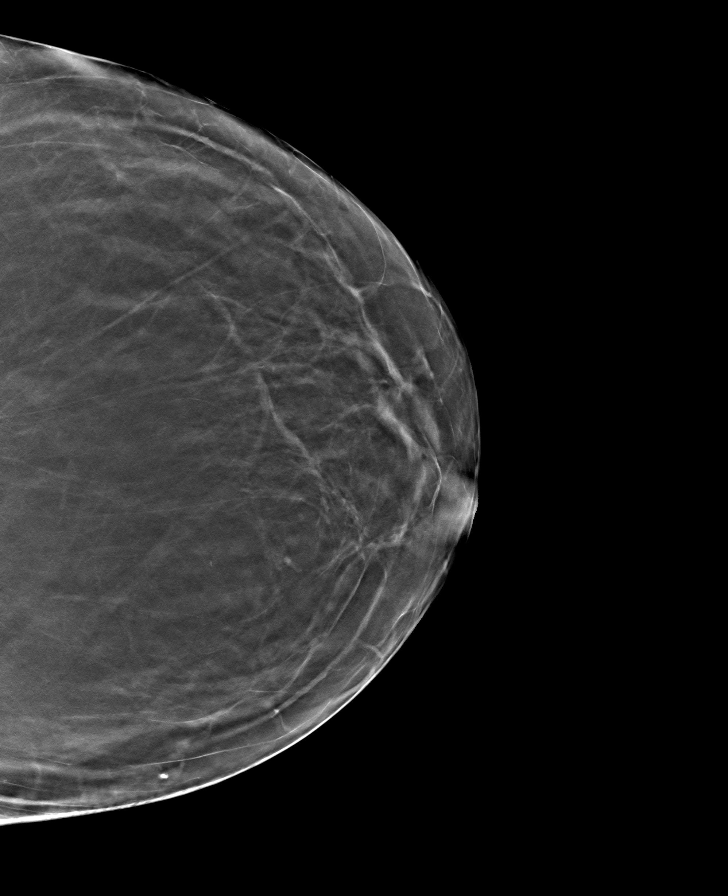

[L MLO tomo · tomo slice 45/89.0]
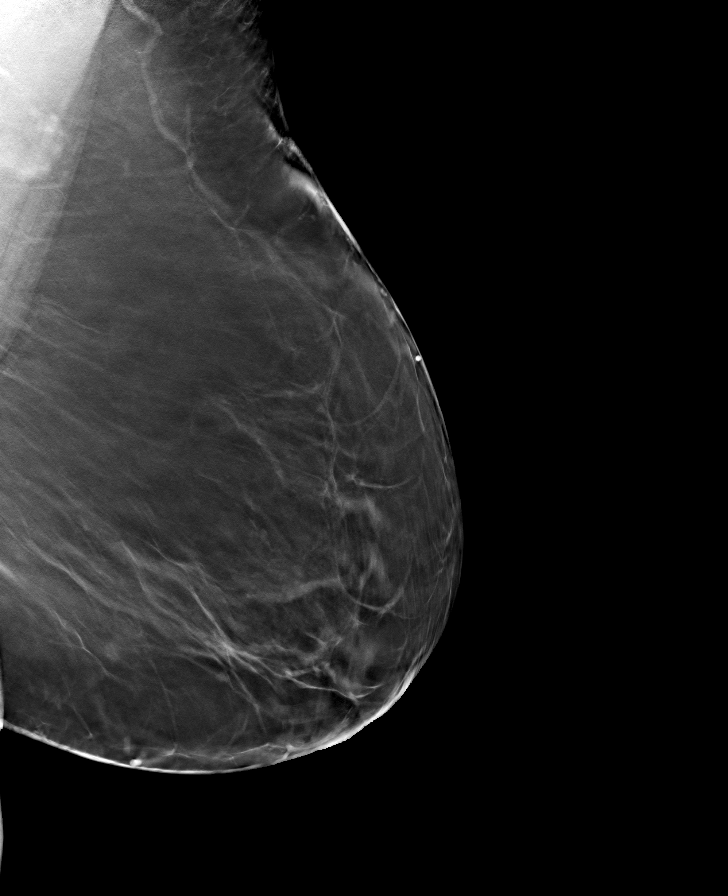

[R MLO tomo · tomo slice 47/92.0]
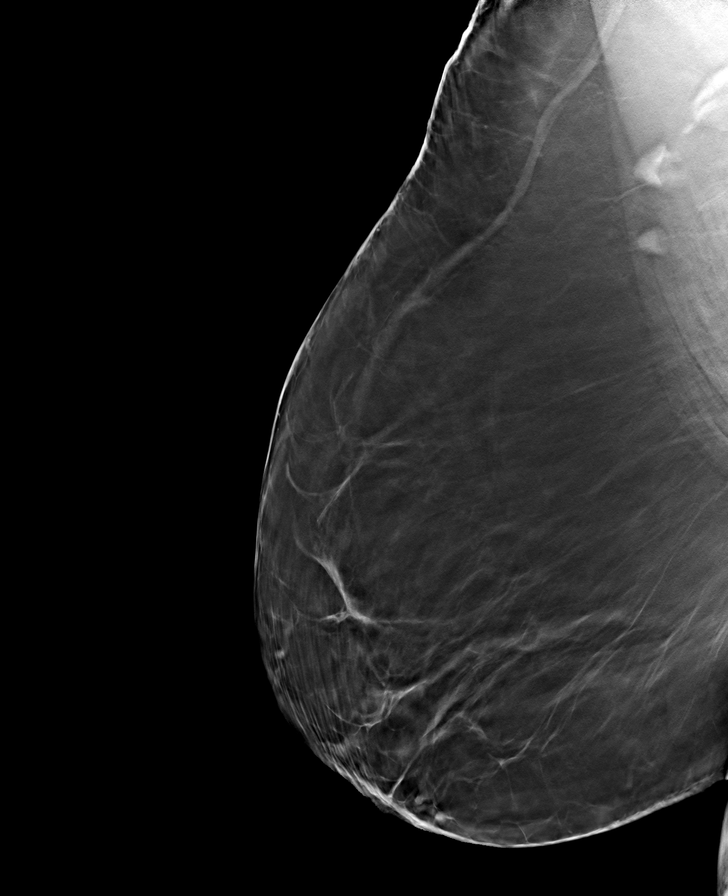

[R CC tomo · tomo slice 33/66.0]
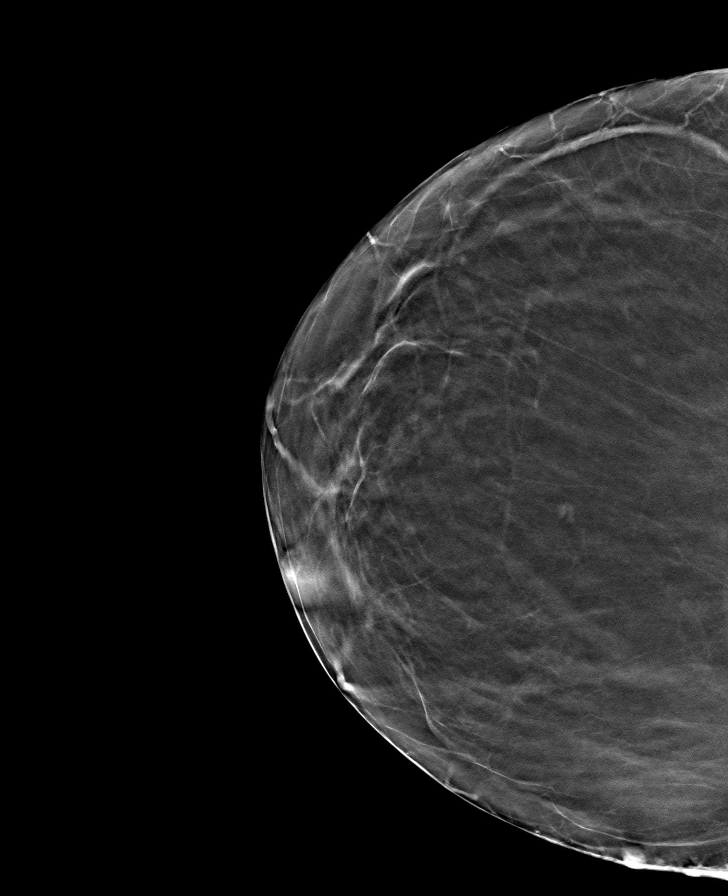

[8 of 24 positions shown; findings below may reference images not displayed]

FINDINGS: There are no findings suspicious for malignancy.
IMPRESSION: No mammographic evidence of malignancy. A result letter of this
screening mammogram will be mailed directly to the patient.

RECOMMENDATION:
Screening mammogram in one year. (Code:0E-3-N98)

BI-RADS CATEGORY  1: Negative.

## 2024-02-09 ENCOUNTER — Other Ambulatory Visit: Payer: Self-pay | Admitting: Internal Medicine

## 2024-02-09 DIAGNOSIS — Z1231 Encounter for screening mammogram for malignant neoplasm of breast: Secondary | ICD-10-CM

## 2024-03-06 ENCOUNTER — Ambulatory Visit
Admission: RE | Admit: 2024-03-06 | Discharge: 2024-03-06 | Disposition: A | Discharge status: Home / Self Care | Source: Ambulatory Visit | Attending: Internal Medicine | Admitting: Internal Medicine

## 2024-03-06 DIAGNOSIS — Z1231 Encounter for screening mammogram for malignant neoplasm of breast: Secondary | ICD-10-CM | POA: Diagnosis present
